# Patient Record
Sex: Female | Born: 1945 | Race: White | Hispanic: No
Health system: Southern US, Community
[De-identification: ages and names within clinical notes are randomized; demographics above are authoritative.]

## PROBLEM LIST (undated history)

## (undated) DIAGNOSIS — H269 Unspecified cataract: Secondary | ICD-10-CM

## (undated) DIAGNOSIS — M810 Age-related osteoporosis without current pathological fracture: Secondary | ICD-10-CM

## (undated) DIAGNOSIS — M1990 Unspecified osteoarthritis, unspecified site: Secondary | ICD-10-CM

## (undated) HISTORY — DX: Age-related osteoporosis without current pathological fracture: M81.0

## (undated) HISTORY — DX: Unspecified osteoarthritis, unspecified site: M19.90

## (undated) HISTORY — DX: Unspecified cataract: H26.9

---

## 2003-12-12 ENCOUNTER — Ambulatory Visit (HOSPITAL_COMMUNITY): Admission: RE | Admit: 2003-12-12 | Discharge: 2003-12-12 | Payer: Self-pay | Admitting: Neurosurgery

## 2004-01-07 ENCOUNTER — Encounter: Admission: RE | Admit: 2004-01-07 | Discharge: 2004-01-07 | Payer: Self-pay | Admitting: Neurosurgery

## 2004-01-28 ENCOUNTER — Encounter: Admission: RE | Admit: 2004-01-28 | Discharge: 2004-01-28 | Payer: Self-pay | Admitting: Neurosurgery

## 2004-02-25 ENCOUNTER — Ambulatory Visit (HOSPITAL_COMMUNITY): Admission: RE | Admit: 2004-02-25 | Discharge: 2004-02-26 | Payer: Self-pay | Admitting: Neurosurgery

## 2004-03-26 ENCOUNTER — Encounter: Admission: RE | Admit: 2004-03-26 | Discharge: 2004-03-26 | Payer: Self-pay | Admitting: Neurosurgery

## 2005-07-05 ENCOUNTER — Other Ambulatory Visit: Admission: RE | Admit: 2005-07-05 | Discharge: 2005-07-05 | Payer: Self-pay | Admitting: Dermatology

## 2006-09-07 ENCOUNTER — Ambulatory Visit (HOSPITAL_COMMUNITY): Admission: RE | Admit: 2006-09-07 | Discharge: 2006-09-07 | Payer: Self-pay | Admitting: Dermatology

## 2008-01-16 ENCOUNTER — Ambulatory Visit: Payer: Self-pay | Admitting: Orthopedic Surgery

## 2008-01-16 DIAGNOSIS — M7710 Lateral epicondylitis, unspecified elbow: Secondary | ICD-10-CM | POA: Insufficient documentation

## 2008-01-16 DIAGNOSIS — M19049 Primary osteoarthritis, unspecified hand: Secondary | ICD-10-CM | POA: Insufficient documentation

## 2008-03-03 ENCOUNTER — Ambulatory Visit: Payer: Self-pay | Admitting: Orthopedic Surgery

## 2008-03-03 DIAGNOSIS — M25549 Pain in joints of unspecified hand: Secondary | ICD-10-CM

## 2008-03-31 ENCOUNTER — Ambulatory Visit: Payer: Self-pay | Admitting: Orthopedic Surgery

## 2009-01-06 ENCOUNTER — Ambulatory Visit (HOSPITAL_COMMUNITY): Admission: RE | Admit: 2009-01-06 | Discharge: 2009-01-06 | Payer: Self-pay | Admitting: Pulmonary Disease

## 2009-01-14 ENCOUNTER — Ambulatory Visit (HOSPITAL_COMMUNITY): Admission: RE | Admit: 2009-01-14 | Discharge: 2009-01-14 | Payer: Self-pay | Admitting: Pulmonary Disease

## 2009-07-08 ENCOUNTER — Ambulatory Visit (HOSPITAL_COMMUNITY)
Admission: RE | Admit: 2009-07-08 | Discharge: 2009-07-08 | Payer: Self-pay | Admitting: Physical Medicine and Rehabilitation

## 2011-02-11 NOTE — Op Note (Signed)
NAME:  Holly [name], Holly [name]                         ACCOUNT NO.:  [id number]   MEDICAL RECORD NO.:  [id number]                   PATIENT TYPE:  OIB   LOCATION:  NA                                   FACILITY:  [facility]   PHYSICIAN:  Hilda Lias, M.D.                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   POSTOPERATIVE DIAGNOSIS:  Left L5 radiculopathy secondary to 4-5 stenosis.   PROCEDURE:  Left L4-5 laminotomy, foraminotomy with decompression L4-5 nerve  root.  Microscope used.   SURGEON:  Hilda Lias, M.D.   ANESTHESIA:  General.   INDICATIONS FOR PROCEDURE:  Ms. Wissink is a 65 year old female complaining  of back pain progressing down to her left leg.  The patient had two  injections.  One of them helped, and the other one did not.  Myelogram  showed she has degenerative disk disease at the L2-3, mostly going to the  right side and L4-5 she had lateral facet hypertrophy with bilateral  compromise.  In view of failed conservative treatment, surgery was advised.  The risks were explained at the history and physical.   DESCRIPTION OF PROCEDURE:  The patient was taken to the operating room and  after intubation, she was placed in a prone manner.  Back was prepped with  Betadine.  Midline incision from L4-L5 was made.  Muscles were retracted  laterally.  X-rays showed we were at L4-5.  We brought the microscope into  the area, went through the level of L4 and the upper L5.  A thick, yellow  ligament was also excised.   We found the thecal sac but it was difficult to visualize the l5 nerve root.  We went laterally, and we removed 1/3rd of the facet.  There were quite a  bit of adhesions and calcifications.  The area for the L5 nerve root was  quite narrow. Using the drill as well as the 2 and 3 mm __________  point,  foraminotomy to decompress the L5-S1 at the L4 was made.  In the end, we had  good decompression.  From then on, Valsalva maneuver was negative. Fentanyl  and Depo-Medrol were left in the epidural space. The wound was closed with  Vicryl and Steri-Strips.   Incidentally, the bones during the dissection showed that they were a little  soft with quite a bit of vascularity, probably secondary to osteopenia  and/or osteoporosis.                                               Hilda Lias, M.D.    EB/MEDQ  D:  02/25/2004  T:  02/25/2004  Job:  [id number]

## 2011-02-11 NOTE — H&P (Signed)
NAME:  Holly [name], Holly [name]                         ACCOUNT NO.:  [id number]   MEDICAL RECORD NO.:  [id number]                   PATIENT TYPE:  OIB   LOCATION:  NA                                   FACILITY:  [facility]   PHYSICIAN:  Hilda Lias, M.D.                DATE OF BIRTH:  [birth year]   DATE OF ADMISSION:  02/25/2004  DATE OF DISCHARGE:                                HISTORY & PHYSICAL   CLINICAL HISTORY:  Ms. Schupp is a lady who came to see me at the  beginning of February of 2005 with back pain and radiation down to the left  groin down to the knee and going to the left foot.  She also has complained  of numbness in that area.  In 1998, the patient was seen by me and an L4-5  diskectomy was done.  That was on the right side, not on the left side.  This time when she came, we decided to go ahead with conservative treatment.  Because of the findings, she wanted to proceed with injection.  Injections  were done twice, but she did not get any relief.  Because of the findings,  she wanted to proceed with surgery.  She denies any pain whatsoever in the  right leg.   PAST MEDICAL HISTORY:  Tubal ligation.   ALLERGIES:  She is not allergic to any medications.   SOCIAL HISTORY:  Negative.   FAMILY HISTORY:  Her father died at 60 with cancer of the lung.  Her mother  died at the age of 30 with heart failure.   REVIEW OF SYSTEMS:  Positive for back pain, left leg pain, and high  cholesterol.   PHYSICAL EXAMINATION:  HEENT:  Normal.  NECK:  Normal.  LUNGS:  Clear.  HEART:  Heart sounds normal.  ABDOMEN:  Normal.  EXTREMITIES:  Normal pulses.  NEUROLOGIC:  Mental status normal.  Cranial nerves normal.  Strength:  She  has weakening of dorsiflexion in the left foot.  She has some numbness which  involved the top of the left foot.  Straight leg raise is positive about 60  degrees.   LABORATORY DATA:  The lumbar x-ray shows degenerative disk disease at  multiple levels.  The  patient had a CT and post CT myelogram which showed  that she has degenerative disk disease with a bone spur at the level of L2-3  going to the right side.  At the level of L4-5, she has a severe case of  arthropathy bilaterally, left worse than right.  There is also a possibility  of herniated disk at the level of L4-5 on the left side.   CLINICAL IMPRESSION:  1. Left L4-5 herniated disk.  2. Facet arthropathy.  3. Incidental right L2-3 herniated disk.   RECOMMENDATIONS:  The patient is being admitted for a left L4-5 diskectomy.  She knows about the risks, such as  infection, CSF leak, worsening of the  pain, paralysis, and the possibility that she might require surgery in the  future, which may need fusion.  She knows that the surgery will not effect  whatsoever the findings on the right side.                                                Hilda Lias, M.D.    EB/MEDQ  D:  02/25/2004  T:  02/25/2004  Job:  [id number]

## 2011-02-28 ENCOUNTER — Inpatient Hospital Stay (HOSPITAL_COMMUNITY)
Admission: RE | Admit: 2011-02-28 | Discharge: 2011-03-06 | DRG: 419 | Disposition: A | Discharge status: Home / Self Care | Payer: Medicare Other | Source: Ambulatory Visit | Attending: General Surgery | Admitting: General Surgery

## 2011-02-28 ENCOUNTER — Inpatient Hospital Stay (HOSPITAL_COMMUNITY): Payer: Medicare Other

## 2011-02-28 DIAGNOSIS — Z23 Encounter for immunization: Secondary | ICD-10-CM

## 2011-02-28 DIAGNOSIS — R0989 Other specified symptoms and signs involving the circulatory and respiratory systems: Secondary | ICD-10-CM | POA: Diagnosis not present

## 2011-02-28 DIAGNOSIS — E860 Dehydration: Secondary | ICD-10-CM | POA: Diagnosis present

## 2011-02-28 DIAGNOSIS — K8010 Calculus of gallbladder with chronic cholecystitis without obstruction: Principal | ICD-10-CM | POA: Diagnosis present

## 2011-02-28 DIAGNOSIS — E7800 Pure hypercholesterolemia, unspecified: Secondary | ICD-10-CM | POA: Diagnosis present

## 2011-02-28 DIAGNOSIS — T41205A Adverse effect of unspecified general anesthetics, initial encounter: Secondary | ICD-10-CM | POA: Diagnosis not present

## 2011-02-28 DIAGNOSIS — F341 Dysthymic disorder: Secondary | ICD-10-CM | POA: Diagnosis present

## 2011-02-28 DIAGNOSIS — D696 Thrombocytopenia, unspecified: Secondary | ICD-10-CM | POA: Diagnosis present

## 2011-02-28 DIAGNOSIS — E876 Hypokalemia: Secondary | ICD-10-CM | POA: Diagnosis present

## 2011-02-28 DIAGNOSIS — R0609 Other forms of dyspnea: Secondary | ICD-10-CM | POA: Diagnosis not present

## 2011-02-28 LAB — DIFFERENTIAL
Basophils Relative: 0 % (ref 0–1)
Eosinophils Absolute: 0 10*3/uL (ref 0.0–0.7)
Eosinophils Relative: 0 % (ref 0–5)
Monocytes Absolute: 0.2 10*3/uL (ref 0.1–1.0)
Monocytes Relative: 5 % (ref 3–12)

## 2011-02-28 LAB — COMPREHENSIVE METABOLIC PANEL
AST: 98 U/L — ABNORMAL HIGH (ref 0–37)
Albumin: 3.7 g/dL (ref 3.5–5.2)
CO2: 27 mEq/L (ref 19–32)
Calcium: 8.8 mg/dL (ref 8.4–10.5)
Creatinine, Ser: 0.6 mg/dL (ref 0.4–1.2)
GFR calc Af Amer: >60 mL/min (ref >60)
GFR calc non Af Amer: >60 mL/min (ref >60)

## 2011-02-28 LAB — CBC
MCH: 28.8 pg (ref 26.0–34.0)
MCHC: 33.8 g/dL (ref 30.0–36.0)
Platelets: 53 10*3/uL — ABNORMAL LOW (ref 150–400)
RDW: 13.7 % (ref 11.5–15.5)

## 2011-02-28 LAB — URINE MICROSCOPIC-ADD ON

## 2011-02-28 LAB — URINALYSIS, ROUTINE W REFLEX MICROSCOPIC
Nitrite: NEGATIVE
Specific Gravity, Urine: >1.03 — ABNORMAL HIGH (ref 1.005–1.030)
pH: 6 (ref 5.0–8.0)

## 2011-03-01 ENCOUNTER — Inpatient Hospital Stay (HOSPITAL_COMMUNITY): Payer: Medicare Other

## 2011-03-01 LAB — ROCKY MTN SPOTTED FVR AB, IGG-BLOOD: RMSF IgG: 0.15 IV

## 2011-03-01 LAB — ROCKY MTN SPOTTED FVR AB, IGM-BLOOD: RMSF IgM: 0.56 IV (ref 0.00–0.89)

## 2011-03-02 ENCOUNTER — Inpatient Hospital Stay (HOSPITAL_COMMUNITY): Payer: Medicare Other

## 2011-03-02 ENCOUNTER — Encounter (HOSPITAL_COMMUNITY): Payer: Self-pay

## 2011-03-02 LAB — BASIC METABOLIC PANEL
CO2: 23 mEq/L (ref 19–32)
Calcium: 7.6 mg/dL — ABNORMAL LOW (ref 8.4–10.5)
Creatinine, Ser: <0.47 mg/dL (ref 0.4–1.2)

## 2011-03-02 LAB — URINE CULTURE

## 2011-03-02 LAB — HEPATITIS PANEL, ACUTE: Hepatitis B Surface Ag: NEGATIVE

## 2011-03-02 MED ORDER — TECHNETIUM TC 99M MEBROFENIN IV KIT
5.0000 | PACK | Freq: Once | INTRAVENOUS | Status: AC | PRN
  Administered 2011-03-02: 4.6 via INTRAVENOUS

## 2011-03-03 LAB — DIFFERENTIAL
Basophils Absolute: 0.2 10*3/uL — ABNORMAL HIGH (ref 0.0–0.1)
Basophils Absolute: 0.2 10*3/uL — ABNORMAL HIGH (ref 0.0–0.1)
Basophils Relative: 3 % — ABNORMAL HIGH (ref 0–1)
Eosinophils Absolute: 0 10*3/uL (ref 0.0–0.7)
Eosinophils Absolute: 0 10*3/uL (ref 0.0–0.7)
Eosinophils Relative: 0 % (ref 0–5)
Lymphocytes Relative: 25 % (ref 12–46)
Monocytes Absolute: 0.3 10*3/uL (ref 0.1–1.0)
Monocytes Absolute: 0.4 10*3/uL (ref 0.1–1.0)
Neutro Abs: 3.8 10*3/uL (ref 1.7–7.7)

## 2011-03-03 LAB — BASIC METABOLIC PANEL
CO2: 23 mEq/L (ref 19–32)
Calcium: 7.2 mg/dL — ABNORMAL LOW (ref 8.4–10.5)
Creatinine, Ser: <0.47 mg/dL (ref 0.4–1.2)
Glucose, Bld: 77 mg/dL (ref 70–99)

## 2011-03-03 LAB — CBC
MCH: 29.2 pg (ref 26.0–34.0)
MCHC: 35 g/dL (ref 30.0–36.0)
MCHC: 35.2 g/dL (ref 30.0–36.0)
MCV: 82.8 fL (ref 78.0–100.0)
Platelets: 76 10*3/uL — ABNORMAL LOW (ref 150–400)
RDW: 14.4 % (ref 11.5–15.5)
RDW: 14.6 % (ref 11.5–15.5)
WBC: 5.9 10*3/uL (ref 4.0–10.5)

## 2011-03-03 LAB — HEPATIC FUNCTION PANEL
AST: 121 U/L — ABNORMAL HIGH (ref 0–37)
Albumin: 2.1 g/dL — ABNORMAL LOW (ref 3.5–5.2)
Alkaline Phosphatase: 219 U/L — ABNORMAL HIGH (ref 39–117)
Total Bilirubin: 0.4 mg/dL (ref 0.3–1.2)

## 2011-03-04 ENCOUNTER — Inpatient Hospital Stay (HOSPITAL_COMMUNITY): Payer: Medicare Other

## 2011-03-04 ENCOUNTER — Other Ambulatory Visit: Payer: Self-pay | Admitting: General Surgery

## 2011-03-04 LAB — CBC
HCT: 30.3 % — ABNORMAL LOW (ref 36.0–46.0)
Hemoglobin: 10.6 g/dL — ABNORMAL LOW (ref 12.0–15.0)
MCH: 28.9 pg (ref 26.0–34.0)
MCV: 82.6 fL (ref 78.0–100.0)
RBC: 3.67 MIL/uL — ABNORMAL LOW (ref 3.87–5.11)
WBC: 7.9 10*3/uL (ref 4.0–10.5)

## 2011-03-04 LAB — PREPARE PLATELET PHERESIS

## 2011-03-04 LAB — DIFFERENTIAL
Lymphocytes Relative: 29 % (ref 12–46)
Lymphs Abs: 2.3 10*3/uL (ref 0.7–4.0)
Monocytes Relative: 7 % (ref 3–12)
Neutro Abs: 4.7 10*3/uL (ref 1.7–7.7)
Neutrophils Relative %: 60 % (ref 43–77)

## 2011-03-05 LAB — DIFFERENTIAL
Basophils Relative: 3 % — ABNORMAL HIGH (ref 0–1)
Eosinophils Relative: 1 % (ref 0–5)
Lymphs Abs: 3.3 10*3/uL (ref 0.7–4.0)
Monocytes Absolute: 0.8 10*3/uL (ref 0.1–1.0)
Monocytes Relative: 8 % (ref 3–12)

## 2011-03-05 LAB — CULTURE, BLOOD (ROUTINE X 2): Culture: NO GROWTH

## 2011-03-05 LAB — CBC
Hemoglobin: 10.3 g/dL — ABNORMAL LOW (ref 12.0–15.0)
MCH: 29.2 pg (ref 26.0–34.0)
MCV: 82.7 fL (ref 78.0–100.0)
Platelets: 212 10*3/uL (ref 150–400)
RBC: 3.53 MIL/uL — ABNORMAL LOW (ref 3.87–5.11)
WBC: 10.2 10*3/uL (ref 4.0–10.5)

## 2011-03-05 LAB — BASIC METABOLIC PANEL
CO2: 26 mEq/L (ref 19–32)
Calcium: 7.3 mg/dL — ABNORMAL LOW (ref 8.4–10.5)
Chloride: 103 mEq/L (ref 96–112)
Creatinine, Ser: <0.47 mg/dL (ref 0.4–1.2)
Glucose, Bld: 93 mg/dL (ref 70–99)
Sodium: 135 mEq/L (ref 135–145)

## 2011-03-05 LAB — PREPARE PLATELET PHERESIS

## 2011-03-06 LAB — CBC
HCT: 32.4 % — ABNORMAL LOW (ref 36.0–46.0)
Hemoglobin: 11.1 g/dL — ABNORMAL LOW (ref 12.0–15.0)
MCV: 83.1 fL (ref 78.0–100.0)
RDW: 15.4 % (ref 11.5–15.5)
WBC: 12.4 10*3/uL — ABNORMAL HIGH (ref 4.0–10.5)

## 2011-03-06 LAB — CROSSMATCH
ABO/RH(D): A NEG
Antibody Screen: NEGATIVE
Unit division: 0

## 2011-03-06 LAB — CULTURE, BLOOD (ROUTINE X 2): Culture: NO GROWTH

## 2011-03-06 LAB — DIFFERENTIAL
Basophils Relative: 2 % — ABNORMAL HIGH (ref 0–1)
Eosinophils Relative: 1 % (ref 0–5)
Lymphocytes Relative: 34 % (ref 12–46)
Lymphs Abs: 4.2 10*3/uL — ABNORMAL HIGH (ref 0.7–4.0)
Monocytes Relative: 10 % (ref 3–12)
Neutro Abs: 6.7 10*3/uL (ref 1.7–7.7)

## 2011-03-06 LAB — BASIC METABOLIC PANEL
BUN: 2 mg/dL — ABNORMAL LOW (ref 6–23)
CO2: 23 mEq/L (ref 19–32)
Glucose, Bld: 89 mg/dL (ref 70–99)
Potassium: 3.3 mEq/L — ABNORMAL LOW (ref 3.5–5.1)
Sodium: 138 mEq/L (ref 135–145)

## 2011-03-06 LAB — HEPARIN INDUCED THROMBOCYTOPENIA PNL
UFH High Dose UFH H: 0 % Release
UFH Low Dose 0.5 IU/mL: 0 % Release
UFH SRA Result: NEGATIVE

## 2011-03-08 NOTE — Op Note (Signed)
NAMEAFNAN, CADIENTE NO.:  [id number]  MEDICAL RECORD NO.:  [id number]  LOCATION:                                 FACILITY:  PHYSICIAN:  Dalia Heading, M.D.  DATE OF BIRTH:  1946-07-20  DATE OF PROCEDURE:  03/04/2011 DATE OF DISCHARGE:                              OPERATIVE REPORT   PREOPERATIVE DIAGNOSES:  Cholecystitis, cholelithiasis.  POSTOPERATIVE DIAGNOSES:  Cholecystitis, cholelithiasis.  PROCEDURE:  Laparoscopic cholecystectomy.  SURGEON:  Dalia Heading, MD  ANESTHESIA:  General endotracheal.  INDICATIONS:  The patient is a 65 year old white female who presents with right upper quadrant abdominal pain secondary to a biliary sludge. She was also noted to have thrombocytopenia.  She did receive two platelet transfusions.  The patient now comes to the operating room for laparoscopic cholecystectomy.  The risks and benefits of the procedure including bleeding, infection, hepatobiliary injury, and the possibility of a blood transfusion were fully explained to the patient, gave informed consent.  PROCEDURE NOTE:  The patient was placed in supine position.  After induction of general endotracheal anesthesia, the abdomen was prepped and draped using the usual sterile technique with DuraPrep.  Surgical site confirmation was performed.  A supraumbilical incision was made down to the fascia.  A Veress needle was introduced into the abdominal cavity and confirmation of placement was done using the saline drop test.  The abdomen was then insufflated to 16 mmHg pressure.  An 11-mm trocar was introduced into the abdominal cavity under direct visualization without difficulty.  The patient was placed in reversed Trendelenburg position.  Additional 11-mm trocar was placed in the epigastric region and 5-mm trocars were placed in the right upper quadrant and right flank regions.  Liver was inspected and noted to be within normal limits.  The gallbladder was  retracted superior and laterally.  The dissection was begun around the infundibulum of the gallbladder.  The cystic duct was first identified. Its juncture to the infundibulum was fully identified.  EndoClip were placed proximally and distally on the cystic duct, and the cystic duct was divided.  This was likewise done of the cystic artery.  The gallbladder was then freed away from the gallbladder fossa using Bovie electrocautery.  The gallbladder was then delivered through the epigastric trocar site using Endocatch bag.  The gallbladder fossa was inspected and no abnormal bleeding or bile leakage was noted.  Surgicel was placed in the gallbladder fossa.  All fluid and air were then evacuated from the abdominal cavity prior to removal of the trocars.  All wounds were irrigated with normal saline.  All wounds were checked with 0.5% to Sensorcaine.  The supraumbilical fascia was reapproximated using an 0 Vicryl interrupted suture.  All skin incisions were closed using staples.  Betadine ointment and dry sterile dressings were applied.All tape and needle counts were correct at the end of the procedure. The patient was extubated in the operating room and went back to recovery room, awake in stable condition.  COMPLICATIONS:  None.  SPECIMEN:  Gallbladder.  BLOOD LOSS:  Minimal.     Dalia Heading, M.D.     MAJ/MEDQ  D:  03/04/2011  T:  03/05/2011  Job:  [id number]  cc:   Ramon Dredge L. Juanetta Gosling, M.D. Fax: [phone]  Electronically Signed by Franky Macho M.D. on 03/08/2011 12:27:19 PM

## 2011-03-08 NOTE — Discharge Summary (Signed)
  NAME:  Holly [name], BUSSER NO.:  [id number]  MEDICAL RECORD NO.:  [id number]  LOCATION:  IC04                          FACILITY:  [facility]  PHYSICIAN:  Dalia Heading, M.D.  DATE OF BIRTH:  [birth year]  DATE OF ADMISSION:  02/28/2011 DATE OF DISCHARGE:  06/10/2012LH                              DISCHARGE SUMMARY   HOSPITAL COURSE SUMMARY:  The patient is a 65 year old white female who was admitted by Dr. Kari Baars for fever of unknown etiology. Workup revealed cholecystitis secondary to cholelithiasis.  Surgery consultation was obtained.  Preoperatively, she had thrombocytopenia of unknown etiology.  She presented with this at the time of admission, though it was elected to stop her Lovenox.  She was given platelet transfusions prior to surgical intervention.  She subsequently went to the operating room on March 04, 2011, underwent laparoscopic cholecystectomy.  Her immediate postoperative course was remarkable for a prolonged neuromuscular blockade secondary to anesthesia.  She had to be reintubated in the operating room, but then subsequently was extubated again in recovery.  She did go to the Intensive Care Unit for closer monitoring.  Her subsequent course was for the most part unremarkable.  Her platelet count rebounded to normal the following day. Chest x-ray did reveal a question of perihilar infiltrates, thus she was continued on Unasyn.  Her diet was advanced without difficulty.  She is being discharged home on postoperative day #2 in good improving condition.  DISCHARGE INSTRUCTIONS:  The patient is to follow up Dr. Franky Macho on March 10, 2011.  DISCHARGE MEDICATIONS: 1. Vicodin 1-2 tablets p.o. q.4 h. p.r.n. pain. 2. Augmentin 1 tablet p.o. b.i.d. x1 week. 3. Calcitonin nasal spray. 4. Effexor ER 75 mg p.o. daily. 5. Simvastatin 40 mg p.o. daily.  PRINCIPAL DIAGNOSES: 1. Cholecystitis, cholelithiasis. 2. Thrombocytopenia, resolved. 3. High  cholesterol levels. 4. Anxiety/depression.  PRINCIPAL PROCEDURE:  Laparoscopic cholecystectomy on March 04, 2011.     Dalia Heading, M.D.     MAJ/MEDQ  D:  03/06/2011  T:  03/06/2011  Job:  [id number]  cc:   Ramon Dredge L. Juanetta Gosling, M.D. Fax: [phone]  Electronically Signed by Franky Macho M.D. on 03/08/2011 12:27:17 PM

## 2011-03-09 NOTE — Group Therapy Note (Signed)
  NAMEJILLYAN, [name]               ACCOUNT NO.:  [id number]  MEDICAL RECORD NO.:  [id number]  LOCATION:  A302                          FACILITY:  [facility]  PHYSICIAN:  Laural Eiland L. Juanetta Gosling, M.D.DATE OF BIRTH:  [birth year]  DATE OF PROCEDURE: DATE OF DISCHARGE:                                PROGRESS NOTE   [name] says she feels better and indeed she does look better today.  Her temperature has resolved with a change in antibiotics.  She has been seen by Dr. Lovell Sheehan and my thanks to him.  PHYSICAL EXAMINATION:  Her exam this morning shows temperature is 98.2, pulse 104, respirations 18, blood pressure 123/72, O2 sats 87% on room air.  Her chest is clear, however, she still has some tenderness in the right upper quadrant, says she has had some nausea.  Her hepatobiliary scan yesterday showed possible filling defect in the gallbladder, gallbladder ejection fraction of 8%.  Of note, is the fact that her CBC shows white count is 5800, hemoglobin is 10.7, platelets only 31,000, though.  On admission, her platelet count was 53,000.  Assessment then she has cholecystitis.  She is going to have surgery tomorrow.  She does have some thrombocytopenia, I have discussed that with Dr. Lovell Sheehan.  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  She is going to be on some oxygen as needed.  I will plan to transfer her to Dr. York [facility].  I am going to investigate whether she has had previous low platelet count.  She has been followed by Dr. Margo Common.     Tamee Battin L. Juanetta Gosling, M.D.     ELH/MEDQ  D:  03/03/2011  T:  03/03/2011  Job:  [id number]  Electronically Signed by Kari Baars M.D. on 03/09/2011 01:38:07 PM

## 2011-03-09 NOTE — H&P (Signed)
  NAMEWILMER, BERRYHILL NO.:  [id number]  MEDICAL RECORD NO.:  [id number]  LOCATION:                                 FACILITY:  PHYSICIAN:  Williemae Muriel L. Juanetta Gosling, M.D.DATE OF BIRTH:  27-Feb-1946  DATE OF ADMISSION:  02/28/2011 DATE OF DISCHARGE:  LH                             HISTORY & PHYSICAL   REASON FOR ADMISSION:  Febrile illness.  HISTORY:  [name] is a 65 year old Caucasian female who came to my office because of a 3-4 day history of fever, chills and weakness.  She says she has been drinking a lot of fluids but seems to still be having problems.  When I saw her in my office she was clearly very weak.  She was dehydrated and had a temperature of 101.8, so I felt she needed to be admitted to the hospital and admitted her.  She has had some cough. She has chronic back pain.  She has not had any urinary symptoms.  She has had a tick bite and it is not totally clear where this is from.  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  MEDICATIONS:  She has been taking simvastatin 40 mg daily, Effexor XR 75 mg daily, and alendronate 70 mg weekly.  SOCIAL HISTORY:  She does not use tobacco or alcohol.  FAMILY HISTORY:  Her father died in his 59s of lung cancer.  Mother died in her 21s of heart failure.  REVIEW OF SYSTEMS:  Except as mentioned is negative.  PHYSICAL EXAMINATION:  GENERAL:  Her temperature is as mentioned.  She looks very dry. HEENT:  Her pupils are reactive.  Her nose and throat are clear. NECK:  Supple without masses. CHEST:  No wheezes, rales, or rhonchi. HEART:  Regular without murmur, gallop, or rub. ABDOMEN:  Soft.  No masses felt.  Bowel sounds present and active. EXTREMITIES:  No clubbing, cyanosis, or edema.  She does not have her rash. CENTRAL NERVOUS SYSTEM:  Grossly intact.  Her tympanic membranes are intact.  Dentition is  good.  ASSESSMENT:  She is acutely ill with febrile illness and it is not clear what this is, she is dehydrated and I am going to admit her to the hospital, start on antibiotics, get blood cultures.  Continue with everything else and follow.     Holly [name] L. Juanetta Gosling, M.D.     ELH/MEDQ  D:  02/28/2011  T:  03/01/2011  Job:  [id number]  Electronically Signed by Kari Baars M.D. on 03/09/2011 01:37:50 PM

## 2011-03-09 NOTE — Group Therapy Note (Signed)
  [name], [name]               ACCOUNT NO.:  [id number]  MEDICAL RECORD NO.:  [id number]  LOCATION:  A302                          FACILITY:  [facility]  PHYSICIAN:  Holly [name], M.D.DATE OF BIRTH:  1946-02-04  DATE OF PROCEDURE: DATE OF DISCHARGE:                                PROGRESS NOTE   Holly [name] was admitted with febrile illness.  She was hypokalemic and dehydrated.  She says she feels some better, but she had temperature as high as 102.5 earlier and then 103 this morning.  She is on antibiotics. It appears that she has a urinary tract infection, but urine culture is still pending.  Her liver enzymes were also elevated, so I am going to have her get all that rechecked as well.  She is going to have an ultrasound of the abdomen.  At this point then, she has fever.  She is better I think with her hydration and my plan is to continue with current treatments and medications.  No changes.  Get the ultrasound.  I will check hepatitis profile and await cultures.  I will get more blood cultures if she spikes again.     Asmara Backs L. [name], M.D.     ELH/MEDQ  D:  03/01/2011  T:  03/01/2011  Job:  [id number]  Electronically Signed by Kari Baars M.D. on 03/09/2011 01:37:58 PM

## 2011-03-09 NOTE — Group Therapy Note (Signed)
  Holly [name], Holly [name]               ACCOUNT NO.:  [id number]  MEDICAL RECORD NO.:  [id number]  LOCATION:  A302                          FACILITY:  [facility]  PHYSICIAN:  Barrie Sigmund L. [name], M.D.DATE OF BIRTH:  06/15/1946  DATE OF PROCEDURE: DATE OF DISCHARGE:                                PROGRESS NOTE   Holly [name] continues to have fever.  Her ultrasound yesterday showed some thickening of the gallbladder wall, some fluid around the gallbladder sludge in the gallbladder and I think she probably does have cholecystitis.  I have asked for surgical consultation and asked for a HIDA scan and it is our recommendation from Radiology.  This morning, she is awake and alert, says she has more nausea and more pain.  Her temperature is 102.9, pulse 112, respirations 16, blood pressure 118/67, O2 sats 90% on room air.  Her abdomen is mildly tender diffusely.  ASSESSMENT:  She is having more trouble.  PLAN:  I have discussed her situation with Dr. Lovell Sheehan.  He will see her after the scan is done.  We will probably change her antibiotics now that we have a better source of infection and the Rocephin, Zithromax combination does not seem to be helping, and I planned for her to probably have surgery later this week.     Holly [name], M.D.     ELH/MEDQ  D:  03/02/2011  T:  03/02/2011  Job:  [id number]  Electronically Signed by Kari Baars M.D. on 03/09/2011 01:38:01 PM

## 2012-06-15 IMAGING — CR DG CHEST 2V
2 series · 2 of 2 positions shown · non-contrast
Comparison: None

CLINICAL DATA: Febrile illness

CHEST - 2 VIEW

[view not recorded (1 of 2)]
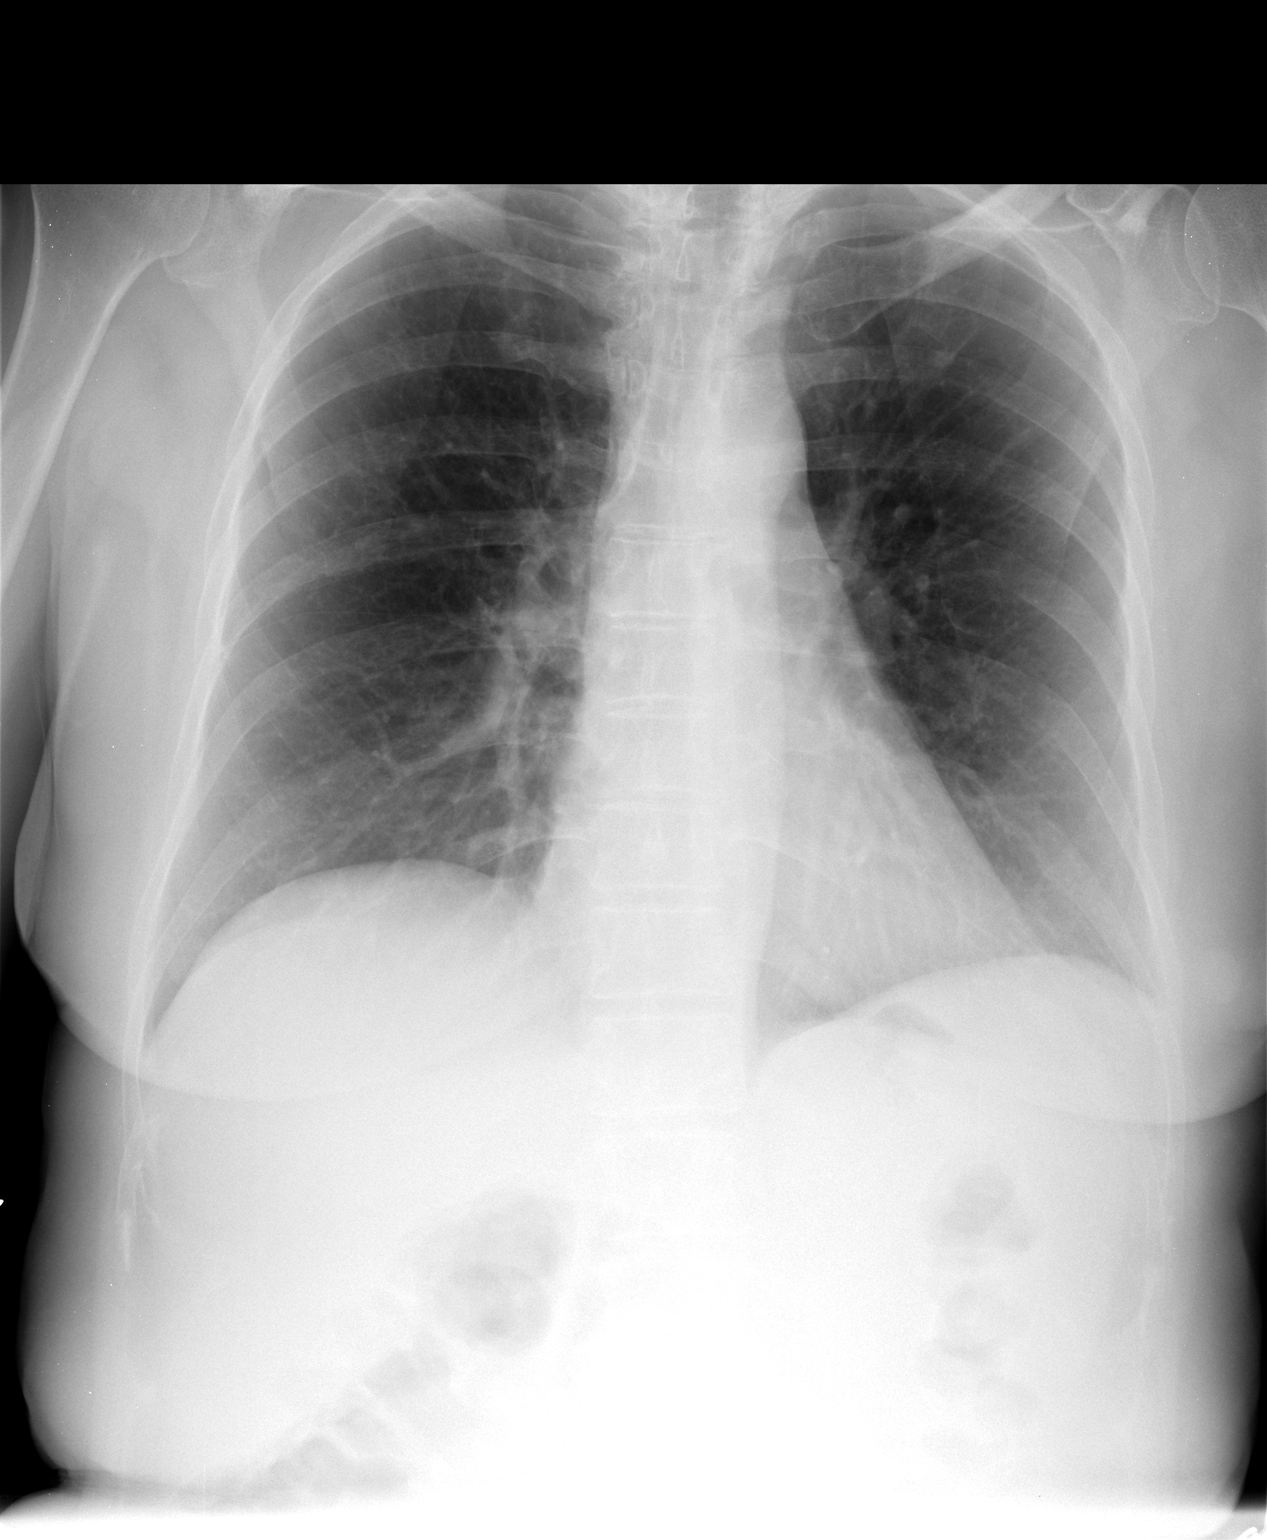

[view not recorded (2 of 2)]
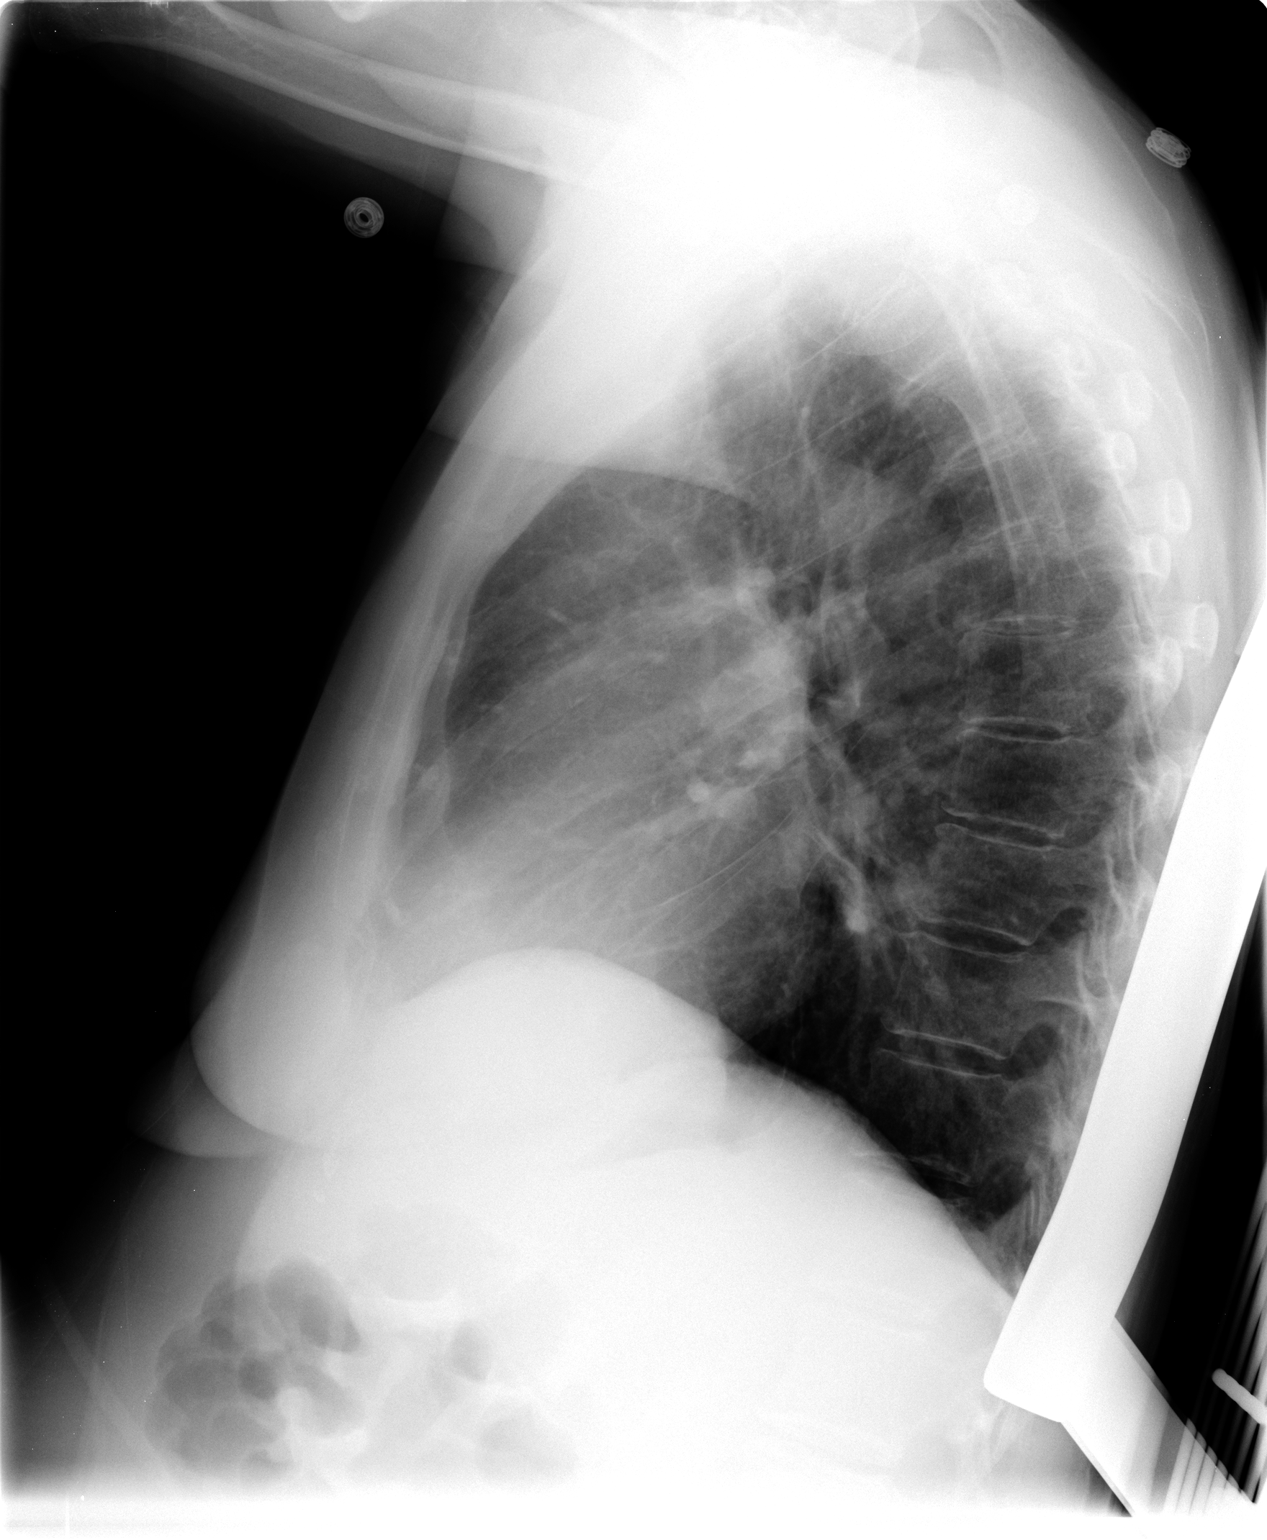

[2 of 2 positions shown; findings below may reference images not displayed]

FINDINGS: Normal heart size, mediastinal contours, and pulmonary vascularity.
Lungs clear.
No pleural effusion or pneumothorax.
Bones unremarkable.
IMPRESSION: No acute abnormalities.

## 2012-06-16 IMAGING — US US ABDOMEN COMPLETE
1 series · 13 of 25 positions shown · non-contrast
Comparison: 01/06/2009

CLINICAL DATA: Abnormal/elevated liver function tests

ULTRASOUND ABDOMEN:
TECHNIQUE: Sonography of upper abdominal structures was performed.

[Series 1: us abdomen complete · 0.28mm/px · 13 of 63 slices shown]
[im 1/63]
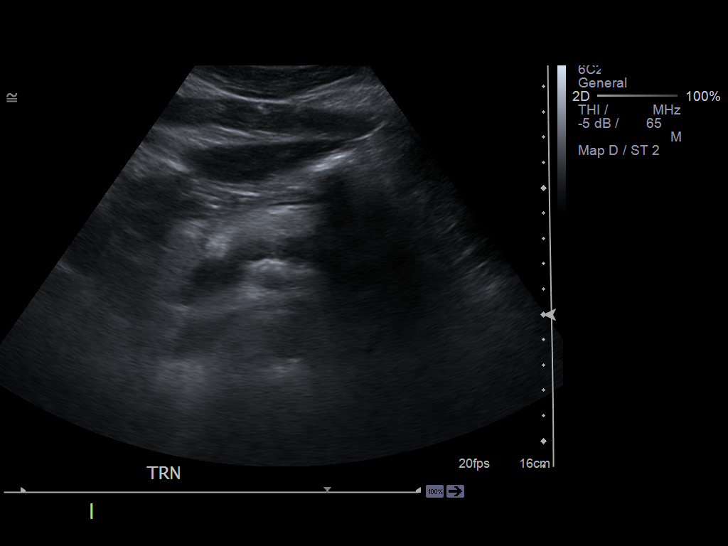
[im 6/63]
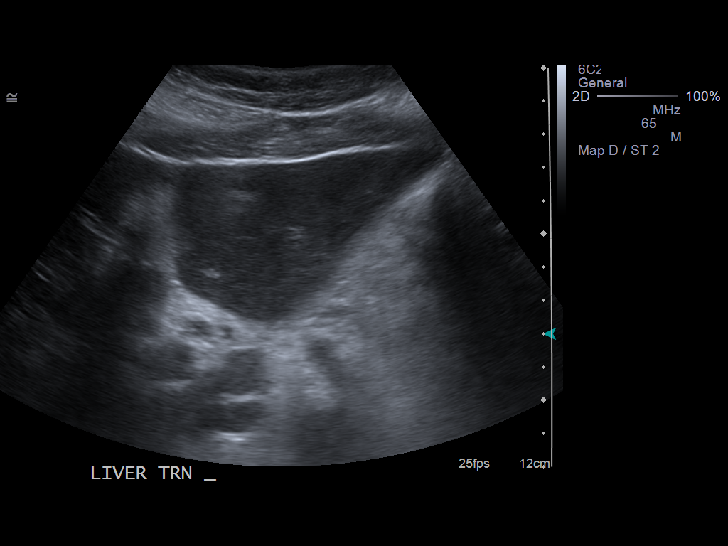
[im 11/63]
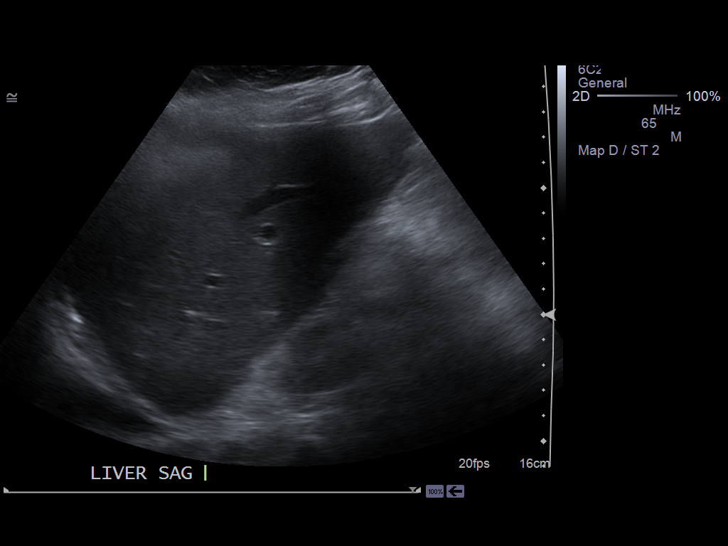
[im 16/63]
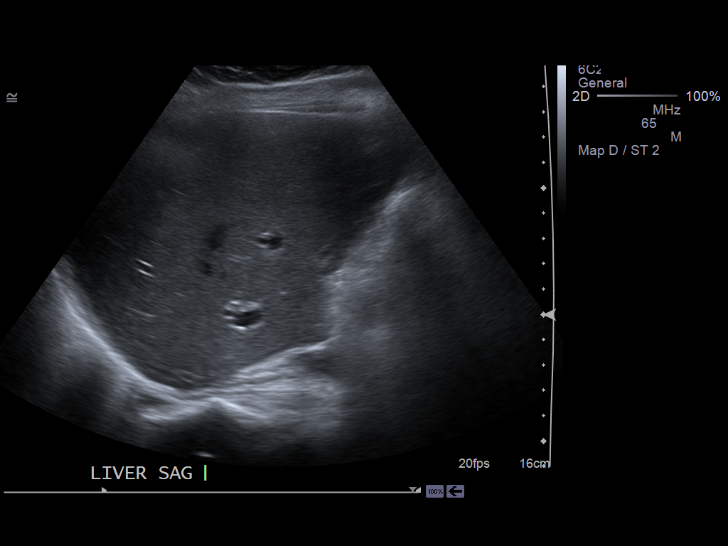
[im 21/63]
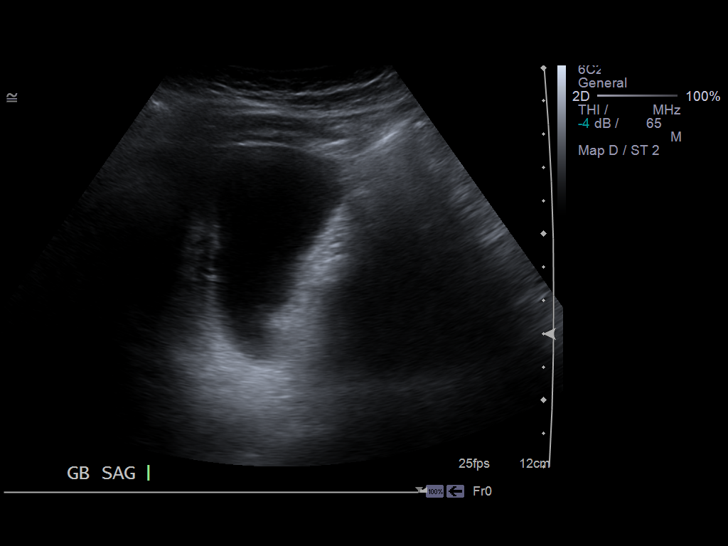
[im 26/63]
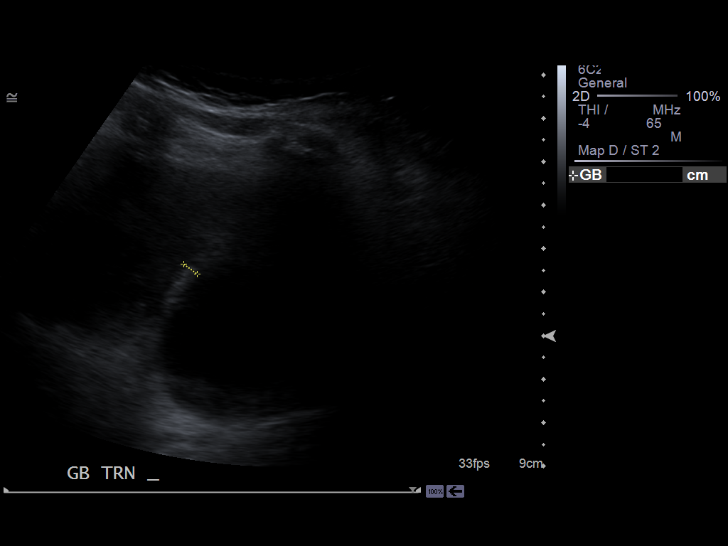
[im 32/63]
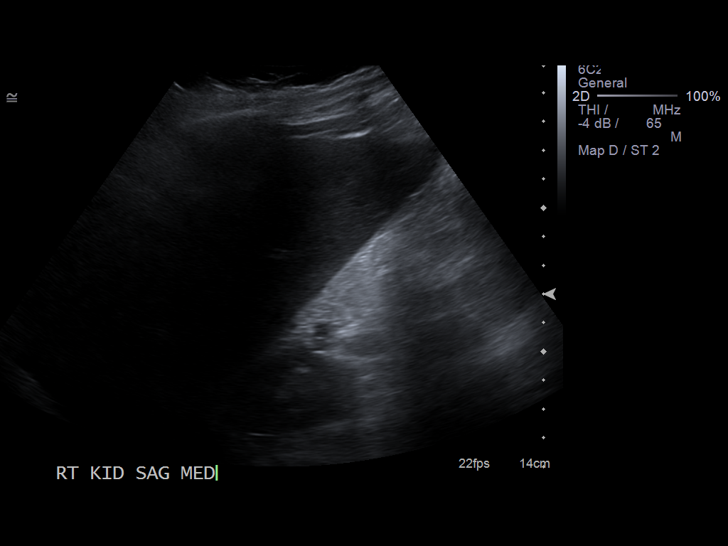
[im 37/63]
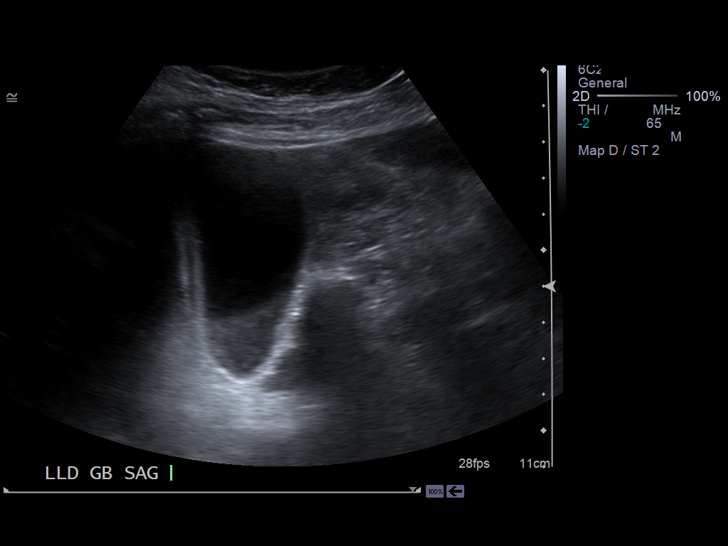
[im 42/63]
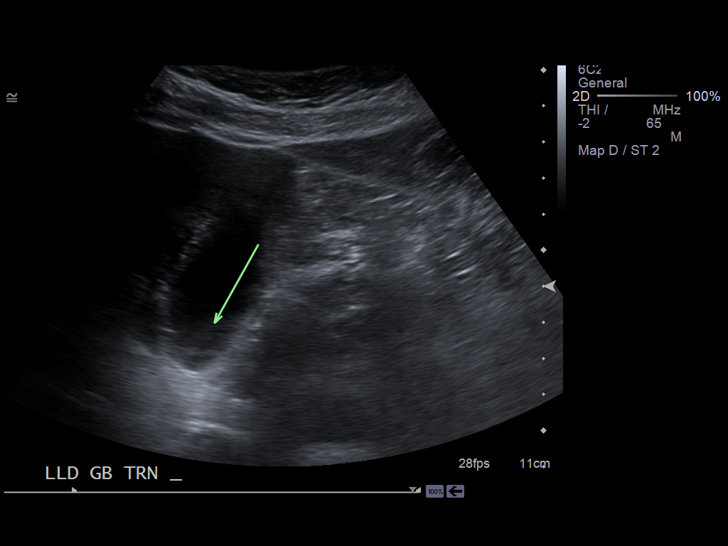
[im 47/63]
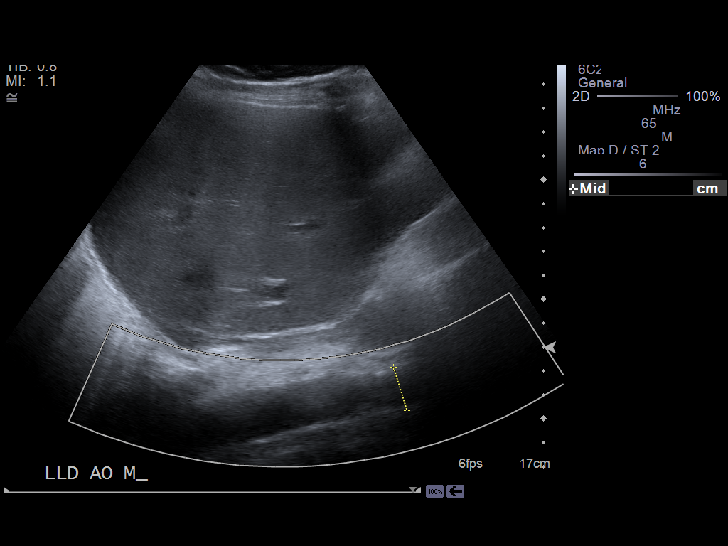
[im 52/63]
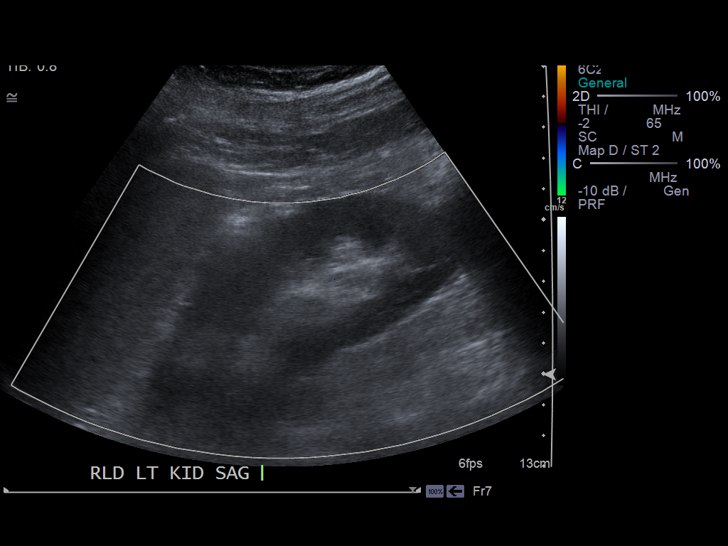
[im 57/63]
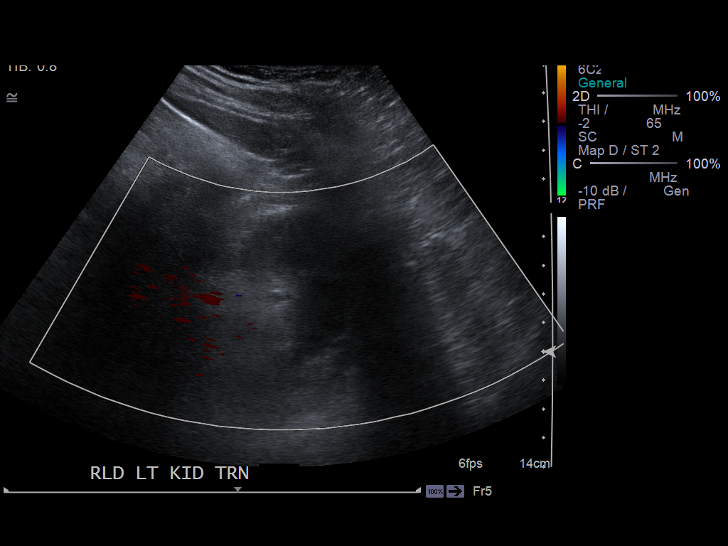
[im 63/63]
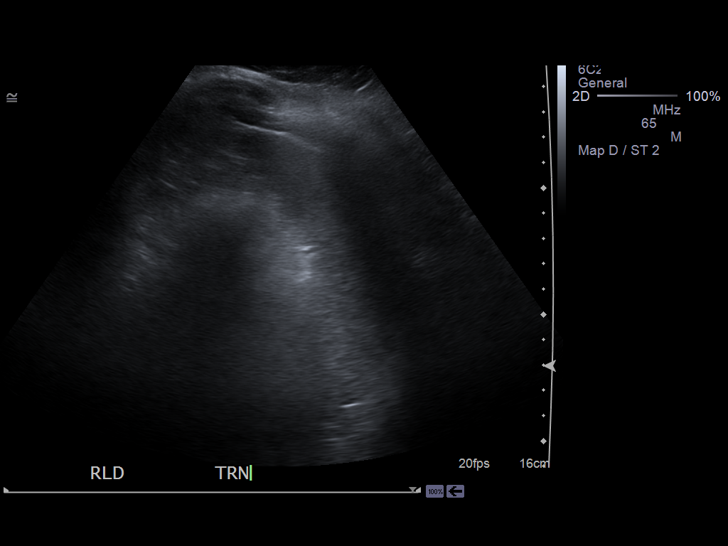

[13 of 25 positions shown; findings below may reference images not displayed]

Gallbladder:  Sludge within gallbladder.  No discrete shadowing
gallstones.  Gallbladder wall is thickened with edema in
gallbladder wall as well as a small amount of question
pericholecystic fluid.  No sonographic Murphy's sign.

Common bile duct:  7 mm diameter, minimally prominent.

Liver:  Mild dilatation of central intrahepatic biliary ducts.  No
definite focal hepatic mass.  Hepatopetal portal venous flow.

IVC:  Normal appearance

Pancreas:  Normal appearance

Spleen:  Normal appearance, 8.4 cm length.

Right kidney:  9.7 cm length. Normal morphology without mass or
hydronephrosis.

Left kidney:  12.5 cm length. Normal morphology without mass or
hydronephrosis.

Aorta:  Normal appearance

Other:  No free fluid.  Small right pleural effusion incidentally
noted.
IMPRESSION: Sludge within gallbladder, with thickening of gallbladder wall
question minimal pericholecystic fluid.
While no definite gallstones or sonographic Murphy's sign are
identified, early acute cholecystitis is not completely excluded;
consider radionuclide hepatobiliary scan to assess CBD patency.
Minimally prominent CBD and central intrahepatic radicles, though
potentially this could be physiologic related to age; CBD diameter
of 7 mm of unchanged since the prior study of 01/06/2009.
Remainder of exam unremarkable.

## 2014-12-23 ENCOUNTER — Other Ambulatory Visit (HOSPITAL_COMMUNITY): Payer: Self-pay | Admitting: Pulmonary Disease

## 2014-12-23 DIAGNOSIS — R7989 Other specified abnormal findings of blood chemistry: Secondary | ICD-10-CM

## 2014-12-23 DIAGNOSIS — R945 Abnormal results of liver function studies: Principal | ICD-10-CM

## 2014-12-26 ENCOUNTER — Other Ambulatory Visit (HOSPITAL_COMMUNITY): Payer: Self-pay | Admitting: Pulmonary Disease

## 2014-12-26 ENCOUNTER — Ambulatory Visit (HOSPITAL_COMMUNITY)
Admission: RE | Admit: 2014-12-26 | Discharge: 2014-12-26 | Disposition: A | Discharge status: Home / Self Care | Payer: Medicare Other | Source: Ambulatory Visit | Attending: Pulmonary Disease | Admitting: Pulmonary Disease

## 2014-12-26 DIAGNOSIS — R7989 Other specified abnormal findings of blood chemistry: Secondary | ICD-10-CM | POA: Diagnosis not present

## 2014-12-26 DIAGNOSIS — Z9049 Acquired absence of other specified parts of digestive tract: Secondary | ICD-10-CM | POA: Insufficient documentation

## 2014-12-26 DIAGNOSIS — K838 Other specified diseases of biliary tract: Secondary | ICD-10-CM | POA: Insufficient documentation

## 2014-12-26 DIAGNOSIS — R945 Abnormal results of liver function studies: Secondary | ICD-10-CM

## 2015-01-02 ENCOUNTER — Other Ambulatory Visit (HOSPITAL_COMMUNITY): Payer: Self-pay | Admitting: Pulmonary Disease

## 2015-01-02 DIAGNOSIS — K838 Other specified diseases of biliary tract: Secondary | ICD-10-CM

## 2015-01-08 ENCOUNTER — Ambulatory Visit (HOSPITAL_COMMUNITY)
Admission: RE | Admit: 2015-01-08 | Discharge: 2015-01-08 | Disposition: A | Discharge status: Home / Self Care | Payer: Medicare Other | Source: Ambulatory Visit | Attending: Pulmonary Disease | Admitting: Pulmonary Disease

## 2015-01-08 ENCOUNTER — Other Ambulatory Visit (HOSPITAL_COMMUNITY): Payer: Self-pay | Admitting: Pulmonary Disease

## 2015-01-08 DIAGNOSIS — K838 Other specified diseases of biliary tract: Secondary | ICD-10-CM | POA: Diagnosis present

## 2015-01-08 LAB — POCT I-STAT CREATININE: CREATININE: 0.6 mg/dL (ref 0.50–1.10)

## 2015-01-08 MED ORDER — GADOBENATE DIMEGLUMINE 529 MG/ML IV SOLN
12.0000 mL | Freq: Once | INTRAVENOUS | Status: AC | PRN
  Administered 2015-01-08: 12 mL via INTRAVENOUS

## 2015-01-08 MED ORDER — SODIUM CHLORIDE 0.9 % IV SOLN
INTRAVENOUS | Status: DC
Start: 2015-01-08 — End: 2015-01-09
  Filled 2015-01-08: qty 50

## 2015-02-24 ENCOUNTER — Other Ambulatory Visit (HOSPITAL_COMMUNITY): Payer: Self-pay | Admitting: Pulmonary Disease

## 2015-02-24 DIAGNOSIS — M8580 Other specified disorders of bone density and structure, unspecified site: Secondary | ICD-10-CM

## 2015-02-27 ENCOUNTER — Ambulatory Visit (HOSPITAL_COMMUNITY)
Admission: RE | Admit: 2015-02-27 | Discharge: 2015-02-27 | Disposition: A | Discharge status: Home / Self Care | Payer: Medicare Other | Source: Ambulatory Visit | Attending: Pulmonary Disease | Admitting: Pulmonary Disease

## 2015-02-27 DIAGNOSIS — Z780 Asymptomatic menopausal state: Secondary | ICD-10-CM | POA: Diagnosis not present

## 2015-02-27 DIAGNOSIS — M810 Age-related osteoporosis without current pathological fracture: Secondary | ICD-10-CM | POA: Insufficient documentation

## 2015-02-27 DIAGNOSIS — M8580 Other specified disorders of bone density and structure, unspecified site: Secondary | ICD-10-CM

## 2015-12-24 ENCOUNTER — Other Ambulatory Visit (HOSPITAL_COMMUNITY): Payer: Self-pay | Admitting: Pulmonary Disease

## 2015-12-24 ENCOUNTER — Ambulatory Visit (HOSPITAL_COMMUNITY)
Admission: RE | Admit: 2015-12-24 | Discharge: 2015-12-24 | Disposition: A | Discharge status: Home / Self Care | Payer: Medicare Other | Source: Ambulatory Visit | Attending: Pulmonary Disease | Admitting: Pulmonary Disease

## 2015-12-24 DIAGNOSIS — M79601 Pain in right arm: Secondary | ICD-10-CM | POA: Diagnosis not present

## 2015-12-24 DIAGNOSIS — M2578 Osteophyte, vertebrae: Secondary | ICD-10-CM | POA: Diagnosis not present

## 2015-12-24 DIAGNOSIS — M4802 Spinal stenosis, cervical region: Secondary | ICD-10-CM | POA: Diagnosis not present

## 2015-12-24 DIAGNOSIS — M50321 Other cervical disc degeneration at C4-C5 level: Secondary | ICD-10-CM | POA: Diagnosis not present

## 2015-12-24 DIAGNOSIS — M50322 Other cervical disc degeneration at C5-C6 level: Secondary | ICD-10-CM | POA: Insufficient documentation

## 2015-12-24 DIAGNOSIS — R52 Pain, unspecified: Secondary | ICD-10-CM

## 2015-12-24 DIAGNOSIS — M47892 Other spondylosis, cervical region: Secondary | ICD-10-CM | POA: Diagnosis not present

## 2017-06-07 ENCOUNTER — Other Ambulatory Visit (HOSPITAL_COMMUNITY): Payer: Self-pay | Admitting: Pulmonary Disease

## 2017-06-07 ENCOUNTER — Ambulatory Visit (HOSPITAL_COMMUNITY)
Admission: RE | Admit: 2017-06-07 | Discharge: 2017-06-07 | Disposition: A | Discharge status: Home / Self Care | Payer: Medicare Other | Source: Ambulatory Visit | Attending: Pulmonary Disease | Admitting: Pulmonary Disease

## 2017-06-07 DIAGNOSIS — Z801 Family history of malignant neoplasm of trachea, bronchus and lung: Secondary | ICD-10-CM | POA: Diagnosis present

## 2017-09-11 DIAGNOSIS — M5431 Sciatica, right side: Secondary | ICD-10-CM | POA: Insufficient documentation

## 2017-10-25 DIAGNOSIS — M25561 Pain in right knee: Secondary | ICD-10-CM

## 2017-10-25 DIAGNOSIS — G8929 Other chronic pain: Secondary | ICD-10-CM | POA: Insufficient documentation

## 2018-02-20 ENCOUNTER — Other Ambulatory Visit: Payer: Self-pay

## 2018-02-20 ENCOUNTER — Encounter: Payer: Self-pay | Admitting: Podiatry

## 2018-02-20 ENCOUNTER — Ambulatory Visit: Payer: Medicare Other | Admitting: Podiatry

## 2018-02-20 ENCOUNTER — Ambulatory Visit (INDEPENDENT_AMBULATORY_CARE_PROVIDER_SITE_OTHER): Payer: Medicare Other

## 2018-02-20 VITALS — BP 148/89 | HR 78 | Resp 16

## 2018-02-20 DIAGNOSIS — M2041 Other hammer toe(s) (acquired), right foot: Secondary | ICD-10-CM | POA: Diagnosis not present

## 2018-02-20 DIAGNOSIS — M2042 Other hammer toe(s) (acquired), left foot: Secondary | ICD-10-CM | POA: Diagnosis not present

## 2018-02-20 DIAGNOSIS — M898X9 Other specified disorders of bone, unspecified site: Secondary | ICD-10-CM

## 2018-02-20 NOTE — Patient Instructions (Signed)
Pre-Operative Instructions  Congratulations, you have decided to take an important step towards improving your quality of life.  You can be assured that the doctors and staff at [facility] will be with you every step of the way.  Here are some important things you should know:  1. Plan to be at the surgery center/hospital at least 1 (one) hour prior to your scheduled time, unless otherwise directed by the surgical center/hospital staff.  You must have a responsible adult accompany you, remain during the surgery and drive you home.  Make sure you have directions to the surgical center/hospital to ensure you arrive on time. 2. If you are having surgery at [facility] or [facility], you will need a copy of your medical history and physical form from your family physician within one month prior to the date of surgery. We will give you a form for your primary physician to complete.  3. We make every effort to accommodate the date you request for surgery.  However, there are times where surgery dates or times have to be moved.  We will contact you as soon as possible if a change in schedule is required.   4. No aspirin/ibuprofen for one week before surgery.  If you are on aspirin, any non-steroidal anti-inflammatory medications (Mobic, Aleve, Ibuprofen) should not be taken seven (7) days prior to your surgery.  You make take Tylenol for pain prior to surgery.  5. Medications - If you are taking daily heart and blood pressure medications, seizure, reflux, allergy, asthma, anxiety, pain or diabetes medications, make sure you notify the surgery center/hospital before the day of surgery so they can tell you which medications you should take or avoid the day of surgery. 6. No food or drink after midnight the night before surgery unless directed otherwise by surgical center/hospital staff. 7. No alcoholic beverages 24-hours prior to surgery.  No smoking 24-hours prior or 24-hours after  surgery. 8. Wear loose pants or shorts. They should be loose enough to fit over bandages, boots, and casts. 9. Don't wear slip-on shoes. Sneakers are preferred. 10. Bring your boot with you to the surgery center/hospital.  Also bring crutches or a walker if your physician has prescribed it for you.  If you do not have this equipment, it will be provided for you after surgery. 11. If you have not been contacted by the surgery center/hospital by the day before your surgery, call to confirm the date and time of your surgery. 12. Leave-time from work may vary depending on the type of surgery you have.  Appropriate arrangements should be made prior to surgery with your employer. 13. Prescriptions will be provided immediately following surgery by your doctor.  Fill these as soon as possible after surgery and take the medication as directed. Pain medications will not be refilled on weekends and must be approved by the doctor. 14. Remove nail polish on the operative foot and avoid getting pedicures prior to surgery. 15. Wash the night before surgery.  The night before surgery wash the foot and leg well with water and the antibacterial soap provided. Be sure to pay special attention to beneath the toenails and in between the toes.  Wash for at least three (3) minutes. Rinse thoroughly with water and dry well with a towel.  Perform this wash unless told not to do so by your physician.  Enclosed: 1 Ice pack (please put in freezer the night before surgery)   1 Hibiclens skin cleaner     Pre-op instructions  If you have any questions regarding the instructions, please do not hesitate to call our office.  [place]: [address], [place], [place] [address] -- [phone]  [place]: [address] [place], [place] [address] -- [phone]  [place]: [address]  [place], [place] [address] -- [phone]  [place]: [address], [address], [place], [place] [address] -- [phone]  Website:  [email or web address] 

## 2018-02-20 NOTE — Progress Notes (Signed)
  Subjective:  Patient ID: Holly [name], female    DOB: October 26, 1945,  MRN: [id number] HPI Chief Complaint  Patient presents with  . Toe Pain    5th toe bilateral (R>L) - tender medial sides x several months, feels callused sometimes, Dr. Elijah Birk said bone spurs, injected (no help) - didn't have a good experience at his office  . New Patient (Initial Visit)    72 y.o. female presents with the above complaint.   ROS: Denies fever chills nausea vomiting muscle aches pains back pain calf pain chest pain shortness of breath and headache.  No past medical history on file.   Current Outpatient Medications:  .  calcitonin, salmon, (MIACALCIN/FORTICAL) 200 UNIT/ACT nasal spray, daily. as directed, Disp: , Rfl: 11 .  simvastatin (ZOCOR) 40 MG tablet, Take 40 mg by mouth daily., Disp: , Rfl: 3  No Known Allergies Review of Systems Objective:   Vitals:   02/20/18 0929  BP: (!) 148/89  Pulse: 78  Resp: 16    General: Well developed, nourished, in no acute distress, alert and oriented x3   Dermatological: Skin is warm, dry and supple bilateral. Nails x 10 are well maintained; remaining integument appears unremarkable at this time. There are no open sores, no preulcerative lesions, no rash or signs of infection present.  Vascular: Dorsalis Pedis artery and Posterior Tibial artery pedal pulses are 2/4 bilateral with immedate capillary fill time. Pedal hair growth present. No varicosities and no lower extremity edema present bilateral.   Neruologic: Grossly intact via light touch bilateral. Vibratory intact via tuning fork bilateral. Protective threshold with Semmes Wienstein monofilament intact to all pedal sites bilateral. Patellar and Achilles deep tendon reflexes 2+ bilateral. No Babinski or clonus noted bilateral.   Musculoskeletal: No gross boney pedal deformities bilateral. No pain, crepitus, or limitation noted with foot and ankle range of motion bilateral. Muscular strength 5/5 in all  groups tested bilateral.  Gait: Unassisted, Nonantalgic.    Radiographs:  Discussed etiology pathology conservative versus surgical therapies.  At this point findings consistent with adductovarus rotated hammertoe deformities fifth bilateral with effusion of the intermediate and distal phalanges of the DIPJ.  This inability to mobilize these toes resulting in her pain.  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    Plan: Discussed etiology pathology conservative surgical therapies discussed derotational arthroplasties but there is bilateral and exostectomy medial aspect of digits bilaterally today.  She understands this is amenable to it signed all 3 pages a consent form.  Dispensed Darco shoes bilaterally we did discuss the possible postop complications which may include but not limited to postop pain bleeding swelling infection recurrence need for the surgery overcorrection under correction.     Christen Bedoya T. [place], [place]

## 2018-02-23 ENCOUNTER — Encounter: Payer: Self-pay | Admitting: Podiatry

## 2018-02-23 DIAGNOSIS — M25774 Osteophyte, right foot: Secondary | ICD-10-CM | POA: Diagnosis not present

## 2018-02-23 DIAGNOSIS — M2041 Other hammer toe(s) (acquired), right foot: Secondary | ICD-10-CM | POA: Diagnosis not present

## 2018-02-23 DIAGNOSIS — M2042 Other hammer toe(s) (acquired), left foot: Secondary | ICD-10-CM | POA: Diagnosis not present

## 2018-02-23 DIAGNOSIS — M25775 Osteophyte, left foot: Secondary | ICD-10-CM | POA: Diagnosis not present

## 2018-02-26 ENCOUNTER — Telehealth: Payer: Self-pay | Admitting: *Deleted

## 2018-02-26 NOTE — Telephone Encounter (Signed)
POST OP CALL-    1) General condition stated by the patient: PRETTY GOOD  2) Is the pt having pain? SOME  3) Pain score: 4  4) Has the pt taken Rx'd medication? PAIN MEDS AS NEEDED  5) Is the pain medication giving relief? YES  6) Any fever, chills, nausea, or vomiting? NO  7) Any shortness of breath or tightness in the calf? NO  8) Is the bandages clean, dry and intact? YES  9) Is the bandage excessively tight? NO   10) Is there excessive bleeding or drainage coming through the bandage? NO  11) Did you understand all of the post op instruction sheet given? YES  12) Any questions or concerns regarding post op care/recovery? ASKED ABOUT DRIVING, I ADVISED TO WAIT UNTIL FIRST POV AND SEE HOW SHE WAS DOING AND DR Al CorpusHYATT WOULD ADVISE WITH FURTHER INSTRUCTIONS    Confirmed POV appointment with patient

## 2018-03-01 ENCOUNTER — Ambulatory Visit (INDEPENDENT_AMBULATORY_CARE_PROVIDER_SITE_OTHER): Payer: Self-pay | Admitting: Podiatry

## 2018-03-01 ENCOUNTER — Ambulatory Visit (INDEPENDENT_AMBULATORY_CARE_PROVIDER_SITE_OTHER): Payer: Medicare Other

## 2018-03-01 VITALS — BP 138/85 | HR 87 | Temp 96.5°F

## 2018-03-01 DIAGNOSIS — M898X9 Other specified disorders of bone, unspecified site: Secondary | ICD-10-CM | POA: Diagnosis not present

## 2018-03-01 DIAGNOSIS — M2041 Other hammer toe(s) (acquired), right foot: Secondary | ICD-10-CM

## 2018-03-01 NOTE — Progress Notes (Signed)
She presents today date of surgery 02/23/2018 status post hammertoe repair fifth digits bilateral x1 week.  She denies fever chills nausea vomiting muscle aches and pains states that she is very happy with her toes they have not really bother her much at all.  Dry sterile dressing was removed today demonstrates no erythema edema saline strange odor sutures are intact margins well coapted radiographs demonstrate well healing arthroplasties.  Assessment: Well-healing surgical foot fifth bilateral.  Plan: Redressed today dressed a compressive dressing follow-up with her in 1 week for possible suture removal.

## 2018-03-08 ENCOUNTER — Ambulatory Visit (INDEPENDENT_AMBULATORY_CARE_PROVIDER_SITE_OTHER): Payer: Self-pay | Admitting: Podiatry

## 2018-03-08 ENCOUNTER — Encounter: Payer: Self-pay | Admitting: Podiatry

## 2018-03-08 DIAGNOSIS — M2041 Other hammer toe(s) (acquired), right foot: Secondary | ICD-10-CM

## 2018-03-08 DIAGNOSIS — M898X9 Other specified disorders of bone, unspecified site: Secondary | ICD-10-CM

## 2018-03-08 DIAGNOSIS — M2042 Other hammer toe(s) (acquired), left foot: Secondary | ICD-10-CM

## 2018-03-08 NOTE — Progress Notes (Signed)
She presents today for her second postop visit date of surgery Feb 23, 2018 hammertoe repair fifth digits bilateral with exostectomy.  She denies fever chills nausea vomiting muscle aches and pains states that my feet feel fine absolutely no pain whatsoever.  Objective: Vital signs are stable alert and oriented x3.  Pulses are palpable.  Neurologic sensorium is intact bilateral.  Sutures are intact margins well coapted removed all the sutures today placed a Coban dressing around the toe I will let her start getting this wet the next 12 hours or so.  She will continue the use of the Darco shoe for 2 more weeks I will follow-up with her.  Assessment: Well-healing surgical toes.  Plan: I will up with her in 2 more weeks

## 2018-03-22 ENCOUNTER — Encounter: Payer: Self-pay | Admitting: Podiatry

## 2018-03-22 ENCOUNTER — Ambulatory Visit (INDEPENDENT_AMBULATORY_CARE_PROVIDER_SITE_OTHER): Payer: Medicare Other | Admitting: Podiatry

## 2018-03-22 DIAGNOSIS — M2042 Other hammer toe(s) (acquired), left foot: Secondary | ICD-10-CM

## 2018-03-22 DIAGNOSIS — M2041 Other hammer toe(s) (acquired), right foot: Secondary | ICD-10-CM

## 2018-03-22 DIAGNOSIS — Z9889 Other specified postprocedural states: Secondary | ICD-10-CM

## 2018-03-22 DIAGNOSIS — M898X9 Other specified disorders of bone, unspecified site: Secondary | ICD-10-CM

## 2018-03-25 NOTE — Progress Notes (Signed)
She presents today for a follow-up of her hammertoe repair date of surgery 02/23/2018.  Fifth digits bilaterally with medial exostectomy's.  She states that they feel good discuss a little tingling when they get swollen but other than that they are doing wonderfully.  Objective: Vital signs are stable she is alert and oriented x3 there is no erythema some mild edema no cellulitis drainage or odor incisions abdominal to heal uneventfully she has good range of motion at the metatarsal phalangeal joint toes are sitting rectus in good position good alignment.  Assessment: Well-healing surgical toes fifth bilateral.  Plan: Follow-up with me on as-needed basis back into regular shoe gear

## 2018-04-19 LAB — GLUCOSE, POCT (MANUAL RESULT ENTRY): POC Glucose: 102 mg/dl — AB (ref 70–99)

## 2019-06-06 ENCOUNTER — Other Ambulatory Visit (HOSPITAL_COMMUNITY): Payer: Self-pay | Admitting: Pulmonary Disease

## 2019-06-06 DIAGNOSIS — M8580 Other specified disorders of bone density and structure, unspecified site: Secondary | ICD-10-CM

## 2019-06-06 LAB — CBC AND DIFFERENTIAL
HCT: 43 (ref 36–46)
Hemoglobin: 14 (ref 12.0–16.0)
Platelets: 273 (ref 150–399)
WBC: 6.1

## 2019-06-06 LAB — BASIC METABOLIC PANEL
BUN: 10 (ref 4–21)
Creatinine: 0.7 (ref <=1.1)
Glucose: 92
Potassium: 4.5 (ref 3.4–5.3)
Sodium: 144 (ref 137–147)

## 2019-06-06 LAB — HEPATIC FUNCTION PANEL
ALT: 8 (ref 7–35)
AST: 16 (ref 13–35)
Alkaline Phosphatase: 72 (ref 25–125)

## 2019-06-07 LAB — LIPID PANEL
Cholesterol: 159 (ref 0–200)
HDL: 74 — AB (ref 35–70)
LDL Cholesterol: 72
Triglycerides: 66 (ref 40–160)

## 2019-06-17 ENCOUNTER — Other Ambulatory Visit: Payer: Self-pay

## 2019-06-17 ENCOUNTER — Ambulatory Visit (HOSPITAL_COMMUNITY)
Admission: RE | Admit: 2019-06-17 | Discharge: 2019-06-17 | Disposition: A | Discharge status: Home / Self Care | Payer: Medicare Other | Source: Ambulatory Visit | Attending: Pulmonary Disease | Admitting: Pulmonary Disease

## 2019-06-17 DIAGNOSIS — M8580 Other specified disorders of bone density and structure, unspecified site: Secondary | ICD-10-CM | POA: Diagnosis present

## 2019-06-17 DIAGNOSIS — Z780 Asymptomatic menopausal state: Secondary | ICD-10-CM | POA: Diagnosis not present

## 2019-06-17 DIAGNOSIS — Z13820 Encounter for screening for osteoporosis: Secondary | ICD-10-CM | POA: Insufficient documentation

## 2019-06-17 LAB — HM DEXA SCAN

## 2019-06-25 LAB — COLOGUARD: Cologuard: NEGATIVE

## 2019-07-04 ENCOUNTER — Ambulatory Visit: Payer: Medicare Other | Admitting: Family Medicine

## 2019-07-04 ENCOUNTER — Encounter: Payer: Self-pay | Admitting: Family Medicine

## 2019-07-04 ENCOUNTER — Other Ambulatory Visit: Payer: Self-pay

## 2019-07-04 VITALS — BP 170/106 | HR 85 | Temp 98.0°F | Ht 61.0 in | Wt 150.4 lb

## 2019-07-04 DIAGNOSIS — M8580 Other specified disorders of bone density and structure, unspecified site: Secondary | ICD-10-CM | POA: Diagnosis not present

## 2019-07-04 DIAGNOSIS — M519 Unspecified thoracic, thoracolumbar and lumbosacral intervertebral disc disorder: Secondary | ICD-10-CM | POA: Insufficient documentation

## 2019-07-04 DIAGNOSIS — E785 Hyperlipidemia, unspecified: Secondary | ICD-10-CM | POA: Diagnosis not present

## 2019-07-04 NOTE — Progress Notes (Addendum)
New Patient Office Visit  Subjective:  Patient ID: Holly [name], female    DOB: June 19, 1946  Age: 73 y.o. MRN: [id number]  CC:  Chief Complaint  Patient presents with  . Establish Care    HPI BRYANAH SIDELL presents for DDD/bone spurs, cataracts   120's/80's-blood pressure at home HTLV-identified in the past-no symptoms-no hematology f/u  Past Medical History:  Diagnosis Date  . Arthritis   . Cataract   . Osteoporosis     History reviewed. No pertinent surgical history.  Family History  Problem Relation Age of Onset  . Cancer Mother   . Cancer Father   . Diabetes Sister   . Stroke Brother     Social History   Socioeconomic History  . Marital status: Widowed    Spouse name: Not on file  . Number of children: Not on file  . Years of education: Not on file  . Highest education level: Not on file  Occupational History  . Occupation: retired  Scientific laboratory technician  . Financial resource strain: Not on file  . Food insecurity    Worry: Not on file    Inability: Not on file  . Transportation needs    Medical: Not on file    Non-medical: Not on file  Tobacco Use  . Smoking status: Unknown If Ever Smoked  . Smokeless tobacco: Never Used  Substance and Sexual Activity  . Alcohol use: Never    Frequency: Never  . Drug use: Never  . Sexual activity: Yes  Lifestyle  . Physical activity    Days per week: Not on file    Minutes per session: Not on file  . Stress: Not on file  Relationships  . Social Herbalist on phone: Not on file    Gets together: Not on file    Attends religious service: Not on file    Active member of club or organization: Not on file    Attends meetings of clubs or organizations: Not on file    Relationship status: Not on file  . Intimate partner violence    Fear of current or ex partner: Not on file    Emotionally abused: Not on file    Physically abused: Not on file    Forced sexual activity: Not on file  Other Topics Concern   . Not on file  Social History Narrative  . Not on file    ROS Review of Systems  Constitutional: Negative.   HENT: Negative.   Eyes: Negative.        Cataract-bilat  Respiratory: Negative.        Asthma  Cardiovascular: Negative.   Gastrointestinal:       IBS  Endocrine: Negative.   Genitourinary: Negative.   Musculoskeletal: Negative.        Osteopenia Bone spurs-Spinal Surgery  Skin:       Derm -back of leg-yearly appt  Allergic/Immunologic: Negative.   Neurological: Negative.   Hematological:       Lymphotropic virus  Psychiatric/Behavioral: Negative.     Objective:   Today's Vitals: BP (!) 170/106 (BP Location: Left Arm, Patient Position: Sitting, Cuff Size: Normal)   Pulse 85   Temp 98 F (36.7 C) (Oral)   Ht 5\' 1"  (1.549 m)   Wt 150 lb 6.4 oz (68.2 kg)   SpO2 96%   BMI 28.42 kg/m   Physical Exam Constitutional:      Appearance: Normal appearance.  HENT:  Head: Normocephalic and atraumatic.     Right Ear: Tympanic membrane, ear canal and external ear normal.     Left Ear: Tympanic membrane, ear canal and external ear normal.     Nose: Nose normal.  Eyes:     Conjunctiva/sclera: Conjunctivae normal.  Neck:     Musculoskeletal: Normal range of motion and neck supple.  Cardiovascular:     Rate and Rhythm: Normal rate and regular rhythm.     Pulses: Normal pulses.     Heart sounds: Normal heart sounds.  Pulmonary:     Effort: Pulmonary effort is normal.     Breath sounds: Normal breath sounds.  Neurological:     Mental Status: She is alert and oriented to person, place, and time.  Psychiatric:        Mood and Affect: Mood normal.        Behavior: Behavior normal.   1. Osteopenia, unspecified location DEXA 9/20  2. Lumbar disc disease  3. Hyperlipidemia, unspecified hyperlipidemia type zocor-lipid panel normal 10/20 Assessment & Plan:  Lakeisha Waldrop Mat Carne, MD

## 2019-10-03 ENCOUNTER — Ambulatory Visit (INDEPENDENT_AMBULATORY_CARE_PROVIDER_SITE_OTHER): Payer: Medicare PPO | Admitting: Family Medicine

## 2019-10-03 ENCOUNTER — Other Ambulatory Visit: Payer: Self-pay

## 2019-10-03 VITALS — BP 134/85 | HR 88 | Temp 98.5°F | Ht 62.0 in | Wt 151.6 lb

## 2019-10-03 DIAGNOSIS — M519 Unspecified thoracic, thoracolumbar and lumbosacral intervertebral disc disorder: Secondary | ICD-10-CM | POA: Diagnosis not present

## 2019-10-03 MED ORDER — CYCLOBENZAPRINE HCL 10 MG PO TABS: ORAL_TABLET | ORAL | 1 refills | Status: AC

## 2019-10-03 MED ORDER — NAPROXEN 500 MG PO TABS: 500.0000 mg | ORAL_TABLET | Freq: Two times a day (BID) | ORAL | 0 refills | Status: DC

## 2019-10-03 NOTE — Progress Notes (Signed)
Acute Office Visit  Subjective:    Patient ID: INFANT Holly [name], female    DOB: 1946/01/24, 74 y.o.   MRN: [id number]  Chief Complaint  Patient presents with  . Groin Pain    L groin pain radiates down L leg x 2wks    HPI Patient is in today for leg pain-known DDD-lumbar. Pt does not take medication topical rubs. No change in urination or BM Able to walk up stairs and complete ADL-MRI 2010  Past Medical History:  Diagnosis Date  . Arthritis   . Cataract   . Osteoporosis     No past surgical history on file.  Family History  Problem Relation Age of Onset  . Cancer Mother   . Cancer Father   . Diabetes Sister   . Stroke Brother     Social History   Socioeconomic History  . Marital status: Widowed    Spouse name: Not on file  . Number of children: Not on file  . Years of education: Not on file  . Highest education level: Not on file  Occupational History  . Occupation: retired  Tobacco Use  . Smoking status: Unknown If Ever Smoked  . Smokeless tobacco: Never Used  Substance and Sexual Activity  . Alcohol use: Never  . Drug use: Never  . Sexual activity: Yes  Other Topics Concern  . Not on file  Social History Narrative  . Not on file   Social Determinants of Health   Financial Resource Strain:   . Difficulty of Paying Living Expenses: Not on file  Food Insecurity:   . Worried About Programme researcher, broadcasting/film/video in the Last Year: Not on file  . Ran Out of Food in the Last Year: Not on file  Transportation Needs:   . Lack of Transportation (Medical): Not on file  . Lack of Transportation (Non-Medical): Not on file  Physical Activity:   . Days of Exercise per Week: Not on file  . Minutes of Exercise per Session: Not on file  Stress:   . Feeling of Stress : Not on file  Social Connections:   . Frequency of Communication with Friends and Family: Not on file  . Frequency of Social Gatherings with Friends and Family: Not on file  . Attends Religious Services: Not  on file  . Active Member of Clubs or Organizations: Not on file  . Attends Banker Meetings: Not on file  . Marital Status: Not on file  Intimate Partner Violence:   . Fear of Current or Ex-Partner: Not on file  . Emotionally Abused: Not on file  . Physically Abused: Not on file  . Sexually Abused: Not on file    Outpatient Medications Prior to Visit  Medication Sig Dispense Refill  . Calcium Carbonate (CALCIUM 500 PO) Take 4 tablets by mouth daily.    . cholecalciferol (VITAMIN D3) 25 MCG (1000 UT) tablet Take 1,000 Units by mouth daily.    . calcitonin, salmon, (MIACALCIN/FORTICAL) 200 UNIT/ACT nasal spray daily. as directed  11  . simvastatin (ZOCOR) 40 MG tablet Take 40 mg by mouth daily.  3   No facility-administered medications prior to visit.    No Known Allergies  Review of Systems  Respiratory: Negative.   Gastrointestinal: Negative for blood in stool and constipation.  Genitourinary: Negative.   Musculoskeletal: Positive for arthralgias, back pain and myalgias. Negative for gait problem.       Objective:    Physical Exam  BP 134/85  Pulse 88   Temp 98.5 F (36.9 C)   Ht 5\' 2"  (1.575 m)   Wt 151 lb 9.6 oz (68.8 kg)   BMI 27.73 kg/m  Wt Readings from Last 3 Encounters:  10/03/19 151 lb 9.6 oz (68.8 kg)  07/04/19 150 lb 6.4 oz (68.2 kg)  01/08/15 130 lb (59 kg)    Health Maintenance Due  Topic Date Due  . MAMMOGRAM  10/02/1995  . COLONOSCOPY  10/02/1995  . PNA vac Low Risk Adult (2 of 2 - PCV13) 06/02/2019    There are no preventive care reminders to display for this patient.   No results found for: TSH Lab Results  Component Value Date   WBC 6.1 06/06/2019   HGB 14.0 06/06/2019   HCT 43 06/06/2019   MCV 83.1 03/06/2011   PLT 273 06/06/2019   Lab Results  Component Value Date   NA 144 06/06/2019   K 4.5 06/06/2019   CO2 23 03/06/2011   GLUCOSE 89 03/06/2011   BUN 10 06/06/2019   CREATININE 0.7 06/06/2019   BILITOT 0.4  03/03/2011   ALKPHOS 72 06/06/2019   AST 16 06/06/2019   ALT 8 06/06/2019   PROT 5.2 (L) 03/03/2011   ALBUMIN 2.1 (L) 03/03/2011   CALCIUM 7.7 (L) 03/06/2011   Lab Results  Component Value Date   CHOL 159 06/06/2019   Lab Results  Component Value Date   HDL 74 (A) 06/06/2019   Lab Results  Component Value Date   LDLCALC 72 06/06/2019   Lab Results  Component Value Date   TRIG 66 06/06/2019     Assessment & Plan:  1. Lumbar disc disease D/w nerve distribution-naprosyn/lfexeril  Pt declines MRI, stretches Kyasia Steuck Hannah Beat, MD

## 2019-10-03 NOTE — Patient Instructions (Signed)
stretches Back Exercises These exercises help to make your trunk and back strong. They also help to keep the lower back flexible. Doing these exercises can help to prevent back pain or lessen existing pain.  If you have back pain, try to do these exercises 2-3 times each day or as told by your doctor.  As you get better, do the exercises once each day. Repeat the exercises more often as told by your doctor.  To stop back pain from coming back, do the exercises once each day, or as told by your doctor. Exercises Single knee to chest Do these steps 3-5 times in a row for each leg: 1. Lie on your back on a firm bed or the floor with your legs stretched out. 2. Bring one knee to your chest. 3. Grab your knee or thigh with both hands and hold them it in place. 4. Pull on your knee until you feel a gentle stretch in your lower back or buttocks. 5. Keep doing the stretch for 10-30 seconds. 6. Slowly let go of your leg and straighten it. Pelvic tilt Do these steps 5-10 times in a row: 1. Lie on your back on a firm bed or the floor with your legs stretched out. 2. Bend your knees so they point up to the ceiling. Your feet should be flat on the floor. 3. Tighten your lower belly (abdomen) muscles to press your lower back against the floor. This will make your tailbone point up to the ceiling instead of pointing down to your feet or the floor. 4. Stay in this position for 5-10 seconds while you gently tighten your muscles and breathe evenly. Cat-cow Do these steps until your lower back bends more easily: 1. Get on your hands and knees on a firm surface. Keep your hands under your shoulders, and keep your knees under your hips. You may put padding under your knees. 2. Let your head hang down toward your chest. Tighten (contract) the muscles in your belly. Point your tailbone toward the floor so your lower back becomes rounded like the back of a cat. 3. Stay in this position for 5 seconds. 4. Slowly  lift your head. Let the muscles of your belly relax. Point your tailbone up toward the ceiling so your back forms a sagging arch like the back of a cow. 5. Stay in this position for 5 seconds.  Press-ups Do these steps 5-10 times in a row: 1. Lie on your belly (face-down) on the floor. 2. Place your hands near your head, about shoulder-width apart. 3. While you keep your back relaxed and keep your hips on the floor, slowly straighten your arms to raise the top half of your body and lift your shoulders. Do not use your back muscles. You may change where you place your hands in order to make yourself more comfortable. 4. Stay in this position for 5 seconds. 5. Slowly return to lying flat on the floor.  Bridges Do these steps 10 times in a row: 1. Lie on your back on a firm surface. 2. Bend your knees so they point up to the ceiling. Your feet should be flat on the floor. Your arms should be flat at your sides, next to your body. 3. Tighten your butt muscles and lift your butt off the floor until your waist is almost as high as your knees. If you do not feel the muscles working in your butt and the back of your thighs, slide your feet 1-2  inches farther away from your butt. 4. Stay in this position for 3-5 seconds. 5. Slowly lower your butt to the floor, and let your butt muscles relax. If this exercise is too easy, try doing it with your arms crossed over your chest. Belly crunches Do these steps 5-10 times in a row: 1. Lie on your back on a firm bed or the floor with your legs stretched out. 2. Bend your knees so they point up to the ceiling. Your feet should be flat on the floor. 3. Cross your arms over your chest. 4. Tip your chin a little bit toward your chest but do not bend your neck. 5. Tighten your belly muscles and slowly raise your chest just enough to lift your shoulder blades a tiny bit off of the floor. Avoid raising your body higher than that, because it can put too much stress  on your low back. 6. Slowly lower your chest and your head to the floor. Back lifts Do these steps 5-10 times in a row: 1. Lie on your belly (face-down) with your arms at your sides, and rest your forehead on the floor. 2. Tighten the muscles in your legs and your butt. 3. Slowly lift your chest off of the floor while you keep your hips on the floor. Keep the back of your head in line with the curve in your back. Look at the floor while you do this. 4. Stay in this position for 3-5 seconds. 5. Slowly lower your chest and your face to the floor. Contact a doctor if:  Your back pain gets a lot worse when you do an exercise.  Your back pain does not get better 2 hours after you exercise. If you have any of these problems, stop doing the exercises. Do not do them again unless your doctor says it is okay. Get help right away if:  You have sudden, very bad back pain. If this happens, stop doing the exercises. Do not do them again unless your doctor says it is okay. This information is not intended to replace advice given to you by your health care provider. Make sure you discuss any questions you have with your health care provider. Document Revised: 06/07/2018 Document Reviewed: 06/07/2018 [employer] Patient Education  2020 [employer]  Adductor Muscle Strain  An adductor muscle strain, also called a groin strain or pull, is an injury to the muscles or tendons on the upper, inner part of the thigh. These muscles are called the adductor muscles or groin muscles. They are responsible for moving the legs across the body or pulling the legs together. A muscle strain occurs when a muscle is overstretched and some muscle fibers are torn. An adductor muscle strain can range from mild to severe, depending on how many muscle fibers are affected and whether the muscle fibers are partially or completely torn. What are the causes? Adductor muscle strains usually occur during exercise or while  participating in sports. The injury often happens when a sudden, violent force is placed on a muscle, stretching the muscle too far. A strain is more likely to happen when your muscles are not warmed up or if you are not properly conditioned. This injury may be caused by:  Stretching the adductor muscles too far or too suddenly, often during side-to-side motion with a sudden change in direction.  Putting repeated stress on the adductor muscles over a long period of time.  Performing vigorous activity without properly stretching the adductor muscles beforehand. What  are the signs or symptoms? Symptoms of this condition include:  Pain and tenderness in the groin area. This begins as sharp pain and persists as a dull ache.  A popping or snapping feeling when the injury occurs (for severe strains).  Swelling or bruising.  Muscle spasms.  Weakness in the leg.  Stiffness in the groin area with decreased ability to move the affected muscles. How is this diagnosed? This condition may be diagnosed based on:  Your symptoms and a description of how the injury occurred.  A physical exam.  Imaging tests, such as: ? X-rays. These are sometimes needed to rule out a broken bone or cartilage problems. ? An ultrasound, CT scan, or MRI. These may be done if your health care provider suspects a complete muscle tear or needs to check for other injuries. How is this treated? An adductor strain will often heal on its own. If needed, this condition may be treated with:  PRICE therapy. PRICE stands for protection of the injured area, rest, ice, pressure (compression), and elevation.  Medicines to help manage pain and swelling (anti-inflammatory medicines).  Crutches. You may be directed to use these for the first few days to minimize your pain. Depending on the severity of the muscle strain, recovery time may vary from a few weeks to several months. Severe injuries often require 4-6 weeks for  recovery. In those cases, complete healing can take 4-5 months. Follow these instructions at home: PRICE Therapy   Protect the muscle from being injured again.  Rest. Do not use the strained muscle if it causes pain.  If directed, put ice on the injured area: ? Put ice in a plastic bag. ? Place a towel between your skin and the bag. ? Leave the ice on for 20 minutes, 2-3 times a day. Do this for the first 2 days after the injury.  Apply compression by wrapping the injured area with an elastic bandage as told by your health care provider.  Raise (elevate) the injured area above the level of your heart while you are sitting or lying down. General instructions  Take over-the-counter and prescription medicines only as told by your health care provider.  Walk, stretch, and do exercises as told by your health care provider. Only do these activities if you can do so without any pain.  Follow your treatment plan as told by your health care provider. This may include: ? Physical therapy. ? Massage. ? Local electrical stimulation (transcutaneous electrical nerve stimulation, TENS). How is this prevented?  Warm up and stretch before being active.  Cool down and stretch after being active.  Give your body time to rest between periods of activity.  Make sure to use equipment that fits you.  Be safe and responsible while being active to avoid slips and falls.  Maintain physical fitness, including: ? Proper conditioning in the adductor muscles. ? Overall strength, flexibility, and endurance. Contact a health care provider if:  You have increased pain or swelling in the affected area.  Your symptoms are not improving or they are getting worse. Summary  An adductor muscle strain, also called a groin strain or pull, is an injury to the muscles or tendons on the upper, inner part of the thigh.  A muscle strain occurs when a muscle is overstretched and some muscle fibers are  torn.  Depending on the severity of the muscle strain, recovery time may vary from a few weeks to several months. This information is not intended to  replace advice given to you by your health care provider. Make sure you discuss any questions you have with your health care provider. Document Revised: 01/01/2019 Document Reviewed: 02/12/2018 [employer] Patient Education  [place].

## 2019-10-15 ENCOUNTER — Other Ambulatory Visit: Payer: Self-pay | Admitting: Family Medicine

## 2019-10-27 ENCOUNTER — Other Ambulatory Visit: Payer: Self-pay | Admitting: Family Medicine

## 2019-11-11 ENCOUNTER — Other Ambulatory Visit: Payer: Self-pay | Admitting: Emergency Medicine

## 2019-11-11 DIAGNOSIS — M519 Unspecified thoracic, thoracolumbar and lumbosacral intervertebral disc disorder: Secondary | ICD-10-CM

## 2019-11-11 MED ORDER — NAPROXEN 500 MG PO TABS: ORAL_TABLET | ORAL | 0 refills | Status: DC

## 2019-11-28 ENCOUNTER — Other Ambulatory Visit: Payer: Self-pay | Admitting: Family Medicine

## 2019-11-28 DIAGNOSIS — M519 Unspecified thoracic, thoracolumbar and lumbosacral intervertebral disc disorder: Secondary | ICD-10-CM

## 2019-11-28 MED ORDER — NAPROXEN 500 MG PO TABS: ORAL_TABLET | ORAL | 1 refills | Status: DC

## 2019-12-23 ENCOUNTER — Other Ambulatory Visit: Payer: Self-pay | Admitting: Family Medicine

## 2019-12-23 DIAGNOSIS — M519 Unspecified thoracic, thoracolumbar and lumbosacral intervertebral disc disorder: Secondary | ICD-10-CM

## 2019-12-23 NOTE — Telephone Encounter (Signed)
Please advise on this refill  

## 2020-01-02 DIAGNOSIS — Z0189 Encounter for other specified special examinations: Secondary | ICD-10-CM | POA: Diagnosis not present

## 2020-01-02 DIAGNOSIS — M79605 Pain in left leg: Secondary | ICD-10-CM | POA: Diagnosis not present

## 2020-01-02 DIAGNOSIS — E785 Hyperlipidemia, unspecified: Secondary | ICD-10-CM | POA: Diagnosis not present

## 2020-01-02 DIAGNOSIS — E559 Vitamin D deficiency, unspecified: Secondary | ICD-10-CM | POA: Diagnosis not present

## 2020-01-02 DIAGNOSIS — M8580 Other specified disorders of bone density and structure, unspecified site: Secondary | ICD-10-CM | POA: Diagnosis not present

## 2020-01-14 DIAGNOSIS — E559 Vitamin D deficiency, unspecified: Secondary | ICD-10-CM | POA: Diagnosis not present

## 2020-01-14 DIAGNOSIS — E785 Hyperlipidemia, unspecified: Secondary | ICD-10-CM | POA: Diagnosis not present

## 2020-01-24 DIAGNOSIS — M8580 Other specified disorders of bone density and structure, unspecified site: Secondary | ICD-10-CM | POA: Diagnosis not present

## 2020-01-24 DIAGNOSIS — E785 Hyperlipidemia, unspecified: Secondary | ICD-10-CM | POA: Diagnosis not present

## 2020-01-24 DIAGNOSIS — E559 Vitamin D deficiency, unspecified: Secondary | ICD-10-CM | POA: Diagnosis not present

## 2020-04-07 DIAGNOSIS — H35033 Hypertensive retinopathy, bilateral: Secondary | ICD-10-CM | POA: Diagnosis not present

## 2020-05-08 DIAGNOSIS — Z1231 Encounter for screening mammogram for malignant neoplasm of breast: Secondary | ICD-10-CM | POA: Diagnosis not present

## 2020-05-08 DIAGNOSIS — Z01419 Encounter for gynecological examination (general) (routine) without abnormal findings: Secondary | ICD-10-CM | POA: Diagnosis not present

## 2020-05-08 DIAGNOSIS — Z124 Encounter for screening for malignant neoplasm of cervix: Secondary | ICD-10-CM | POA: Diagnosis not present

## 2020-07-06 ENCOUNTER — Ambulatory Visit: Payer: Medicare Other | Admitting: Family Medicine

## 2020-12-02 DIAGNOSIS — K581 Irritable bowel syndrome with constipation: Secondary | ICD-10-CM | POA: Diagnosis not present

## 2020-12-02 DIAGNOSIS — R1030 Lower abdominal pain, unspecified: Secondary | ICD-10-CM | POA: Diagnosis not present

## 2021-02-11 DIAGNOSIS — E559 Vitamin D deficiency, unspecified: Secondary | ICD-10-CM | POA: Diagnosis not present

## 2021-02-11 DIAGNOSIS — E785 Hyperlipidemia, unspecified: Secondary | ICD-10-CM | POA: Diagnosis not present

## 2021-02-15 DIAGNOSIS — M545 Low back pain, unspecified: Secondary | ICD-10-CM | POA: Diagnosis not present

## 2021-02-15 DIAGNOSIS — E782 Mixed hyperlipidemia: Secondary | ICD-10-CM | POA: Diagnosis not present

## 2021-02-15 DIAGNOSIS — B9733 Human T-cell lymphotrophic virus, type I [HTLV-I] as the cause of diseases classified elsewhere: Secondary | ICD-10-CM | POA: Diagnosis not present

## 2021-02-15 DIAGNOSIS — K580 Irritable bowel syndrome with diarrhea: Secondary | ICD-10-CM | POA: Diagnosis not present

## 2021-02-15 DIAGNOSIS — M8580 Other specified disorders of bone density and structure, unspecified site: Secondary | ICD-10-CM | POA: Diagnosis not present

## 2021-02-26 DIAGNOSIS — M549 Dorsalgia, unspecified: Secondary | ICD-10-CM | POA: Diagnosis not present

## 2021-02-26 DIAGNOSIS — M47816 Spondylosis without myelopathy or radiculopathy, lumbar region: Secondary | ICD-10-CM | POA: Diagnosis not present

## 2021-03-01 DIAGNOSIS — M4305 Spondylolysis, thoracolumbar region: Secondary | ICD-10-CM | POA: Diagnosis not present

## 2021-03-04 DIAGNOSIS — M545 Low back pain, unspecified: Secondary | ICD-10-CM | POA: Diagnosis not present

## 2021-03-04 DIAGNOSIS — M47816 Spondylosis without myelopathy or radiculopathy, lumbar region: Secondary | ICD-10-CM | POA: Diagnosis not present

## 2021-03-05 DIAGNOSIS — M4305 Spondylolysis, thoracolumbar region: Secondary | ICD-10-CM | POA: Diagnosis not present

## 2021-03-08 DIAGNOSIS — M4305 Spondylolysis, thoracolumbar region: Secondary | ICD-10-CM | POA: Diagnosis not present

## 2021-03-11 DIAGNOSIS — M4305 Spondylolysis, thoracolumbar region: Secondary | ICD-10-CM | POA: Diagnosis not present

## 2021-03-15 DIAGNOSIS — M4305 Spondylolysis, thoracolumbar region: Secondary | ICD-10-CM | POA: Diagnosis not present

## 2021-03-16 DIAGNOSIS — M47816 Spondylosis without myelopathy or radiculopathy, lumbar region: Secondary | ICD-10-CM | POA: Diagnosis not present

## 2021-03-18 DIAGNOSIS — M4305 Spondylolysis, thoracolumbar region: Secondary | ICD-10-CM | POA: Diagnosis not present

## 2021-03-22 DIAGNOSIS — M4305 Spondylolysis, thoracolumbar region: Secondary | ICD-10-CM | POA: Diagnosis not present

## 2021-03-25 DIAGNOSIS — M545 Low back pain, unspecified: Secondary | ICD-10-CM | POA: Diagnosis not present

## 2021-03-25 DIAGNOSIS — R030 Elevated blood-pressure reading, without diagnosis of hypertension: Secondary | ICD-10-CM | POA: Diagnosis not present

## 2021-03-25 DIAGNOSIS — M4305 Spondylolysis, thoracolumbar region: Secondary | ICD-10-CM | POA: Diagnosis not present

## 2021-04-01 DIAGNOSIS — M4305 Spondylolysis, thoracolumbar region: Secondary | ICD-10-CM | POA: Diagnosis not present

## 2021-04-02 DIAGNOSIS — M4305 Spondylolysis, thoracolumbar region: Secondary | ICD-10-CM | POA: Diagnosis not present

## 2021-04-05 DIAGNOSIS — M4305 Spondylolysis, thoracolumbar region: Secondary | ICD-10-CM | POA: Diagnosis not present

## 2021-04-05 DIAGNOSIS — Z20822 Contact with and (suspected) exposure to covid-19: Secondary | ICD-10-CM | POA: Diagnosis not present

## 2021-04-05 DIAGNOSIS — U071 COVID-19: Secondary | ICD-10-CM | POA: Diagnosis not present

## 2021-04-14 DIAGNOSIS — M4305 Spondylolysis, thoracolumbar region: Secondary | ICD-10-CM | POA: Diagnosis not present

## 2021-04-15 DIAGNOSIS — M4305 Spondylolysis, thoracolumbar region: Secondary | ICD-10-CM | POA: Diagnosis not present

## 2021-04-19 DIAGNOSIS — M4305 Spondylolysis, thoracolumbar region: Secondary | ICD-10-CM | POA: Diagnosis not present

## 2021-05-18 DIAGNOSIS — Z01419 Encounter for gynecological examination (general) (routine) without abnormal findings: Secondary | ICD-10-CM | POA: Diagnosis not present

## 2021-06-29 DIAGNOSIS — Z809 Family history of malignant neoplasm, unspecified: Secondary | ICD-10-CM | POA: Diagnosis not present

## 2021-06-29 DIAGNOSIS — E785 Hyperlipidemia, unspecified: Secondary | ICD-10-CM | POA: Diagnosis not present

## 2021-06-29 DIAGNOSIS — Z20822 Contact with and (suspected) exposure to covid-19: Secondary | ICD-10-CM | POA: Diagnosis not present

## 2021-06-29 DIAGNOSIS — M810 Age-related osteoporosis without current pathological fracture: Secondary | ICD-10-CM | POA: Diagnosis not present

## 2021-06-29 DIAGNOSIS — Z885 Allergy status to narcotic agent status: Secondary | ICD-10-CM | POA: Diagnosis not present

## 2021-08-18 DIAGNOSIS — E782 Mixed hyperlipidemia: Secondary | ICD-10-CM | POA: Diagnosis not present

## 2021-08-18 DIAGNOSIS — M8580 Other specified disorders of bone density and structure, unspecified site: Secondary | ICD-10-CM | POA: Diagnosis not present

## 2021-08-23 ENCOUNTER — Other Ambulatory Visit (HOSPITAL_COMMUNITY): Payer: Self-pay | Admitting: Internal Medicine

## 2021-08-23 DIAGNOSIS — Z0001 Encounter for general adult medical examination with abnormal findings: Secondary | ICD-10-CM | POA: Diagnosis not present

## 2021-08-23 DIAGNOSIS — M8580 Other specified disorders of bone density and structure, unspecified site: Secondary | ICD-10-CM

## 2021-08-23 DIAGNOSIS — B9733 Human T-cell lymphotrophic virus, type I [HTLV-I] as the cause of diseases classified elsewhere: Secondary | ICD-10-CM | POA: Diagnosis not present

## 2021-08-23 DIAGNOSIS — M545 Low back pain, unspecified: Secondary | ICD-10-CM | POA: Diagnosis not present

## 2021-08-23 DIAGNOSIS — E782 Mixed hyperlipidemia: Secondary | ICD-10-CM | POA: Diagnosis not present

## 2021-08-23 DIAGNOSIS — R030 Elevated blood-pressure reading, without diagnosis of hypertension: Secondary | ICD-10-CM | POA: Diagnosis not present

## 2021-08-23 DIAGNOSIS — K580 Irritable bowel syndrome with diarrhea: Secondary | ICD-10-CM | POA: Diagnosis not present

## 2021-08-23 DIAGNOSIS — B372 Candidiasis of skin and nail: Secondary | ICD-10-CM | POA: Diagnosis not present

## 2021-08-23 DIAGNOSIS — Z23 Encounter for immunization: Secondary | ICD-10-CM | POA: Diagnosis not present

## 2021-08-30 ENCOUNTER — Ambulatory Visit (HOSPITAL_COMMUNITY)
Admission: RE | Admit: 2021-08-30 | Discharge: 2021-08-30 | Disposition: A | Discharge status: Home / Self Care | Payer: Medicare PPO | Source: Ambulatory Visit | Attending: Internal Medicine | Admitting: Internal Medicine

## 2021-08-30 ENCOUNTER — Other Ambulatory Visit: Payer: Self-pay

## 2021-08-30 DIAGNOSIS — Z780 Asymptomatic menopausal state: Secondary | ICD-10-CM | POA: Diagnosis not present

## 2021-08-30 DIAGNOSIS — Z13820 Encounter for screening for osteoporosis: Secondary | ICD-10-CM | POA: Insufficient documentation

## 2021-08-30 DIAGNOSIS — M8589 Other specified disorders of bone density and structure, multiple sites: Secondary | ICD-10-CM | POA: Diagnosis not present

## 2021-08-30 DIAGNOSIS — M8580 Other specified disorders of bone density and structure, unspecified site: Secondary | ICD-10-CM

## 2021-09-01 DIAGNOSIS — L570 Actinic keratosis: Secondary | ICD-10-CM | POA: Diagnosis not present

## 2021-09-01 DIAGNOSIS — Z1283 Encounter for screening for malignant neoplasm of skin: Secondary | ICD-10-CM | POA: Diagnosis not present

## 2021-09-01 DIAGNOSIS — L309 Dermatitis, unspecified: Secondary | ICD-10-CM | POA: Diagnosis not present

## 2021-09-01 DIAGNOSIS — D239 Other benign neoplasm of skin, unspecified: Secondary | ICD-10-CM | POA: Diagnosis not present

## 2021-10-04 DIAGNOSIS — Z1231 Encounter for screening mammogram for malignant neoplasm of breast: Secondary | ICD-10-CM | POA: Diagnosis not present

## 2021-10-05 DIAGNOSIS — E785 Hyperlipidemia, unspecified: Secondary | ICD-10-CM | POA: Diagnosis not present

## 2021-10-05 DIAGNOSIS — Z809 Family history of malignant neoplasm, unspecified: Secondary | ICD-10-CM | POA: Diagnosis not present

## 2021-10-05 DIAGNOSIS — G8929 Other chronic pain: Secondary | ICD-10-CM | POA: Diagnosis not present

## 2021-10-05 DIAGNOSIS — Z833 Family history of diabetes mellitus: Secondary | ICD-10-CM | POA: Diagnosis not present

## 2021-10-05 DIAGNOSIS — M810 Age-related osteoporosis without current pathological fracture: Secondary | ICD-10-CM | POA: Diagnosis not present

## 2021-10-05 DIAGNOSIS — R000 Tachycardia, unspecified: Secondary | ICD-10-CM | POA: Diagnosis not present

## 2021-10-05 DIAGNOSIS — M8580 Other specified disorders of bone density and structure, unspecified site: Secondary | ICD-10-CM | POA: Diagnosis not present

## 2021-10-05 DIAGNOSIS — Z8249 Family history of ischemic heart disease and other diseases of the circulatory system: Secondary | ICD-10-CM | POA: Diagnosis not present

## 2021-10-05 DIAGNOSIS — R030 Elevated blood-pressure reading, without diagnosis of hypertension: Secondary | ICD-10-CM | POA: Diagnosis not present

## 2021-10-18 DIAGNOSIS — K219 Gastro-esophageal reflux disease without esophagitis: Secondary | ICD-10-CM | POA: Diagnosis not present

## 2021-11-30 DIAGNOSIS — M542 Cervicalgia: Secondary | ICD-10-CM | POA: Diagnosis not present

## 2022-02-17 DIAGNOSIS — E782 Mixed hyperlipidemia: Secondary | ICD-10-CM | POA: Diagnosis not present

## 2022-02-22 DIAGNOSIS — B9733 Human T-cell lymphotrophic virus, type I [HTLV-I] as the cause of diseases classified elsewhere: Secondary | ICD-10-CM | POA: Diagnosis not present

## 2022-02-22 DIAGNOSIS — E782 Mixed hyperlipidemia: Secondary | ICD-10-CM | POA: Diagnosis not present

## 2022-02-22 DIAGNOSIS — M545 Low back pain, unspecified: Secondary | ICD-10-CM | POA: Diagnosis not present

## 2022-02-22 DIAGNOSIS — K580 Irritable bowel syndrome with diarrhea: Secondary | ICD-10-CM | POA: Diagnosis not present

## 2022-02-22 DIAGNOSIS — K219 Gastro-esophageal reflux disease without esophagitis: Secondary | ICD-10-CM | POA: Diagnosis not present

## 2022-02-22 DIAGNOSIS — R030 Elevated blood-pressure reading, without diagnosis of hypertension: Secondary | ICD-10-CM | POA: Diagnosis not present

## 2022-02-22 DIAGNOSIS — M8580 Other specified disorders of bone density and structure, unspecified site: Secondary | ICD-10-CM | POA: Diagnosis not present

## 2022-03-07 DIAGNOSIS — J069 Acute upper respiratory infection, unspecified: Secondary | ICD-10-CM | POA: Diagnosis not present

## 2022-08-17 DIAGNOSIS — E782 Mixed hyperlipidemia: Secondary | ICD-10-CM | POA: Diagnosis not present

## 2022-08-25 DIAGNOSIS — I10 Essential (primary) hypertension: Secondary | ICD-10-CM | POA: Diagnosis not present

## 2022-08-25 DIAGNOSIS — K580 Irritable bowel syndrome with diarrhea: Secondary | ICD-10-CM | POA: Diagnosis not present

## 2022-08-25 DIAGNOSIS — B9733 Human T-cell lymphotrophic virus, type I [HTLV-I] as the cause of diseases classified elsewhere: Secondary | ICD-10-CM | POA: Diagnosis not present

## 2022-08-25 DIAGNOSIS — M545 Low back pain, unspecified: Secondary | ICD-10-CM | POA: Diagnosis not present

## 2022-08-25 DIAGNOSIS — K219 Gastro-esophageal reflux disease without esophagitis: Secondary | ICD-10-CM | POA: Diagnosis not present

## 2022-08-25 DIAGNOSIS — M25562 Pain in left knee: Secondary | ICD-10-CM | POA: Diagnosis not present

## 2022-08-25 DIAGNOSIS — M8580 Other specified disorders of bone density and structure, unspecified site: Secondary | ICD-10-CM | POA: Diagnosis not present

## 2022-08-25 DIAGNOSIS — E782 Mixed hyperlipidemia: Secondary | ICD-10-CM | POA: Diagnosis not present

## 2022-08-25 DIAGNOSIS — Z23 Encounter for immunization: Secondary | ICD-10-CM | POA: Diagnosis not present

## 2022-09-27 DIAGNOSIS — H35362 Drusen (degenerative) of macula, left eye: Secondary | ICD-10-CM | POA: Diagnosis not present

## 2022-10-10 DIAGNOSIS — L570 Actinic keratosis: Secondary | ICD-10-CM | POA: Diagnosis not present

## 2022-10-10 DIAGNOSIS — L280 Lichen simplex chronicus: Secondary | ICD-10-CM | POA: Diagnosis not present

## 2022-10-10 DIAGNOSIS — D239 Other benign neoplasm of skin, unspecified: Secondary | ICD-10-CM | POA: Diagnosis not present

## 2022-10-10 DIAGNOSIS — Z1283 Encounter for screening for malignant neoplasm of skin: Secondary | ICD-10-CM | POA: Diagnosis not present

## 2023-01-16 DIAGNOSIS — M25562 Pain in left knee: Secondary | ICD-10-CM | POA: Diagnosis not present

## 2023-01-16 DIAGNOSIS — G8929 Other chronic pain: Secondary | ICD-10-CM | POA: Diagnosis not present

## 2023-02-17 DIAGNOSIS — R7301 Impaired fasting glucose: Secondary | ICD-10-CM | POA: Diagnosis not present

## 2023-02-17 DIAGNOSIS — E782 Mixed hyperlipidemia: Secondary | ICD-10-CM | POA: Diagnosis not present

## 2023-02-22 DIAGNOSIS — Z0000 Encounter for general adult medical examination without abnormal findings: Secondary | ICD-10-CM | POA: Diagnosis not present

## 2023-02-23 DIAGNOSIS — I10 Essential (primary) hypertension: Secondary | ICD-10-CM | POA: Diagnosis not present

## 2023-02-23 DIAGNOSIS — M8580 Other specified disorders of bone density and structure, unspecified site: Secondary | ICD-10-CM | POA: Diagnosis not present

## 2023-02-23 DIAGNOSIS — K580 Irritable bowel syndrome with diarrhea: Secondary | ICD-10-CM | POA: Diagnosis not present

## 2023-02-23 DIAGNOSIS — M545 Low back pain, unspecified: Secondary | ICD-10-CM | POA: Diagnosis not present

## 2023-02-23 DIAGNOSIS — E782 Mixed hyperlipidemia: Secondary | ICD-10-CM | POA: Diagnosis not present

## 2023-02-23 DIAGNOSIS — M25562 Pain in left knee: Secondary | ICD-10-CM | POA: Diagnosis not present

## 2023-02-23 DIAGNOSIS — K219 Gastro-esophageal reflux disease without esophagitis: Secondary | ICD-10-CM | POA: Diagnosis not present

## 2023-02-23 DIAGNOSIS — B9733 Human T-cell lymphotrophic virus, type I [HTLV-I] as the cause of diseases classified elsewhere: Secondary | ICD-10-CM | POA: Diagnosis not present

## 2023-02-23 DIAGNOSIS — Z23 Encounter for immunization: Secondary | ICD-10-CM | POA: Diagnosis not present

## 2023-03-03 DIAGNOSIS — I129 Hypertensive chronic kidney disease with stage 1 through stage 4 chronic kidney disease, or unspecified chronic kidney disease: Secondary | ICD-10-CM | POA: Diagnosis not present

## 2023-03-03 DIAGNOSIS — K219 Gastro-esophageal reflux disease without esophagitis: Secondary | ICD-10-CM | POA: Diagnosis not present

## 2023-03-03 DIAGNOSIS — M545 Low back pain, unspecified: Secondary | ICD-10-CM | POA: Diagnosis not present

## 2023-03-03 DIAGNOSIS — K589 Irritable bowel syndrome without diarrhea: Secondary | ICD-10-CM | POA: Diagnosis not present

## 2023-03-03 DIAGNOSIS — E785 Hyperlipidemia, unspecified: Secondary | ICD-10-CM | POA: Diagnosis not present

## 2023-03-03 DIAGNOSIS — Z8249 Family history of ischemic heart disease and other diseases of the circulatory system: Secondary | ICD-10-CM | POA: Diagnosis not present

## 2023-03-03 DIAGNOSIS — G8929 Other chronic pain: Secondary | ICD-10-CM | POA: Diagnosis not present

## 2023-03-03 DIAGNOSIS — N182 Chronic kidney disease, stage 2 (mild): Secondary | ICD-10-CM | POA: Diagnosis not present

## 2023-03-03 DIAGNOSIS — M810 Age-related osteoporosis without current pathological fracture: Secondary | ICD-10-CM | POA: Diagnosis not present

## 2023-03-07 DIAGNOSIS — Z1211 Encounter for screening for malignant neoplasm of colon: Secondary | ICD-10-CM | POA: Diagnosis not present

## 2023-08-07 DIAGNOSIS — S161XXA Strain of muscle, fascia and tendon at neck level, initial encounter: Secondary | ICD-10-CM | POA: Diagnosis not present

## 2023-08-16 DIAGNOSIS — M8580 Other specified disorders of bone density and structure, unspecified site: Secondary | ICD-10-CM | POA: Diagnosis not present

## 2023-08-16 DIAGNOSIS — R7301 Impaired fasting glucose: Secondary | ICD-10-CM | POA: Diagnosis not present

## 2023-08-16 DIAGNOSIS — E782 Mixed hyperlipidemia: Secondary | ICD-10-CM | POA: Diagnosis not present

## 2023-08-22 DIAGNOSIS — S161XXD Strain of muscle, fascia and tendon at neck level, subsequent encounter: Secondary | ICD-10-CM | POA: Diagnosis not present

## 2023-08-22 DIAGNOSIS — B9733 Human T-cell lymphotrophic virus, type I [HTLV-I] as the cause of diseases classified elsewhere: Secondary | ICD-10-CM | POA: Diagnosis not present

## 2023-08-22 DIAGNOSIS — E782 Mixed hyperlipidemia: Secondary | ICD-10-CM | POA: Diagnosis not present

## 2023-08-22 DIAGNOSIS — S161XXA Strain of muscle, fascia and tendon at neck level, initial encounter: Secondary | ICD-10-CM | POA: Diagnosis not present

## 2023-08-22 DIAGNOSIS — M8580 Other specified disorders of bone density and structure, unspecified site: Secondary | ICD-10-CM | POA: Diagnosis not present

## 2023-08-22 DIAGNOSIS — M545 Low back pain, unspecified: Secondary | ICD-10-CM | POA: Diagnosis not present

## 2023-08-22 DIAGNOSIS — Z0000 Encounter for general adult medical examination without abnormal findings: Secondary | ICD-10-CM | POA: Diagnosis not present

## 2023-08-22 DIAGNOSIS — Z23 Encounter for immunization: Secondary | ICD-10-CM | POA: Diagnosis not present

## 2023-08-22 DIAGNOSIS — I10 Essential (primary) hypertension: Secondary | ICD-10-CM | POA: Diagnosis not present

## 2023-09-14 DIAGNOSIS — M85852 Other specified disorders of bone density and structure, left thigh: Secondary | ICD-10-CM | POA: Diagnosis not present

## 2023-09-14 DIAGNOSIS — Z780 Asymptomatic menopausal state: Secondary | ICD-10-CM | POA: Diagnosis not present

## 2023-09-14 DIAGNOSIS — M810 Age-related osteoporosis without current pathological fracture: Secondary | ICD-10-CM | POA: Diagnosis not present

## 2023-09-29 DIAGNOSIS — H35033 Hypertensive retinopathy, bilateral: Secondary | ICD-10-CM | POA: Diagnosis not present

## 2023-10-09 DIAGNOSIS — Z1231 Encounter for screening mammogram for malignant neoplasm of breast: Secondary | ICD-10-CM | POA: Diagnosis not present

## 2023-11-06 DIAGNOSIS — L570 Actinic keratosis: Secondary | ICD-10-CM | POA: Diagnosis not present

## 2023-11-06 DIAGNOSIS — L309 Dermatitis, unspecified: Secondary | ICD-10-CM | POA: Diagnosis not present

## 2023-11-06 DIAGNOSIS — I781 Nevus, non-neoplastic: Secondary | ICD-10-CM | POA: Diagnosis not present

## 2024-02-22 DIAGNOSIS — R7301 Impaired fasting glucose: Secondary | ICD-10-CM | POA: Diagnosis not present

## 2024-02-22 DIAGNOSIS — E782 Mixed hyperlipidemia: Secondary | ICD-10-CM | POA: Diagnosis not present

## 2024-02-22 DIAGNOSIS — M8580 Other specified disorders of bone density and structure, unspecified site: Secondary | ICD-10-CM | POA: Diagnosis not present

## 2024-02-27 DIAGNOSIS — I10 Essential (primary) hypertension: Secondary | ICD-10-CM | POA: Diagnosis not present

## 2024-02-27 DIAGNOSIS — K580 Irritable bowel syndrome with diarrhea: Secondary | ICD-10-CM | POA: Diagnosis not present

## 2024-02-27 DIAGNOSIS — S161XXA Strain of muscle, fascia and tendon at neck level, initial encounter: Secondary | ICD-10-CM | POA: Diagnosis not present

## 2024-02-27 DIAGNOSIS — M8580 Other specified disorders of bone density and structure, unspecified site: Secondary | ICD-10-CM | POA: Diagnosis not present

## 2024-02-27 DIAGNOSIS — R7301 Impaired fasting glucose: Secondary | ICD-10-CM | POA: Diagnosis not present

## 2024-02-27 DIAGNOSIS — K219 Gastro-esophageal reflux disease without esophagitis: Secondary | ICD-10-CM | POA: Diagnosis not present

## 2024-02-27 DIAGNOSIS — E782 Mixed hyperlipidemia: Secondary | ICD-10-CM | POA: Diagnosis not present

## 2024-02-27 DIAGNOSIS — M545 Low back pain, unspecified: Secondary | ICD-10-CM | POA: Diagnosis not present

## 2024-02-27 DIAGNOSIS — B9733 Human T-cell lymphotrophic virus, type I [HTLV-I] as the cause of diseases classified elsewhere: Secondary | ICD-10-CM | POA: Diagnosis not present

## 2024-03-28 DIAGNOSIS — H43813 Vitreous degeneration, bilateral: Secondary | ICD-10-CM | POA: Diagnosis not present

## 2024-05-07 DIAGNOSIS — L280 Lichen simplex chronicus: Secondary | ICD-10-CM | POA: Diagnosis not present

## 2024-05-07 DIAGNOSIS — B009 Herpesviral infection, unspecified: Secondary | ICD-10-CM | POA: Diagnosis not present

## 2024-05-09 DIAGNOSIS — H401211 Low-tension glaucoma, right eye, mild stage: Secondary | ICD-10-CM | POA: Diagnosis not present

## 2024-05-30 DIAGNOSIS — R232 Flushing: Secondary | ICD-10-CM | POA: Diagnosis not present

## 2024-05-30 DIAGNOSIS — Z20822 Contact with and (suspected) exposure to covid-19: Secondary | ICD-10-CM | POA: Diagnosis not present

## 2024-05-30 DIAGNOSIS — I10 Essential (primary) hypertension: Secondary | ICD-10-CM | POA: Diagnosis not present

## 2024-05-30 DIAGNOSIS — Z79899 Other long term (current) drug therapy: Secondary | ICD-10-CM | POA: Diagnosis not present

## 2024-05-30 DIAGNOSIS — J069 Acute upper respiratory infection, unspecified: Secondary | ICD-10-CM | POA: Diagnosis not present

## 2024-07-22 DIAGNOSIS — H409 Unspecified glaucoma: Secondary | ICD-10-CM | POA: Diagnosis not present

## 2024-07-22 DIAGNOSIS — K219 Gastro-esophageal reflux disease without esophagitis: Secondary | ICD-10-CM | POA: Diagnosis not present

## 2024-07-22 DIAGNOSIS — I10 Essential (primary) hypertension: Secondary | ICD-10-CM | POA: Diagnosis not present

## 2024-07-22 DIAGNOSIS — Z809 Family history of malignant neoplasm, unspecified: Secondary | ICD-10-CM | POA: Diagnosis not present

## 2024-07-22 DIAGNOSIS — M810 Age-related osteoporosis without current pathological fracture: Secondary | ICD-10-CM | POA: Diagnosis not present

## 2024-07-22 DIAGNOSIS — Z8249 Family history of ischemic heart disease and other diseases of the circulatory system: Secondary | ICD-10-CM | POA: Diagnosis not present

## 2024-07-22 DIAGNOSIS — Z833 Family history of diabetes mellitus: Secondary | ICD-10-CM | POA: Diagnosis not present

## 2024-07-22 DIAGNOSIS — E785 Hyperlipidemia, unspecified: Secondary | ICD-10-CM | POA: Diagnosis not present

## 2024-08-21 DIAGNOSIS — M8580 Other specified disorders of bone density and structure, unspecified site: Secondary | ICD-10-CM | POA: Diagnosis not present

## 2024-08-21 DIAGNOSIS — R7301 Impaired fasting glucose: Secondary | ICD-10-CM | POA: Diagnosis not present

## 2024-08-28 DIAGNOSIS — B9733 Human T-cell lymphotrophic virus, type I [HTLV-I] as the cause of diseases classified elsewhere: Secondary | ICD-10-CM | POA: Diagnosis not present

## 2024-08-28 DIAGNOSIS — Z0000 Encounter for general adult medical examination without abnormal findings: Secondary | ICD-10-CM | POA: Diagnosis not present

## 2024-08-28 DIAGNOSIS — E782 Mixed hyperlipidemia: Secondary | ICD-10-CM | POA: Diagnosis not present

## 2024-08-28 DIAGNOSIS — M545 Low back pain, unspecified: Secondary | ICD-10-CM | POA: Diagnosis not present

## 2024-08-28 DIAGNOSIS — K580 Irritable bowel syndrome with diarrhea: Secondary | ICD-10-CM | POA: Diagnosis not present

## 2024-08-28 DIAGNOSIS — M8580 Other specified disorders of bone density and structure, unspecified site: Secondary | ICD-10-CM | POA: Diagnosis not present

## 2024-08-28 DIAGNOSIS — R7301 Impaired fasting glucose: Secondary | ICD-10-CM | POA: Diagnosis not present

## 2024-08-28 DIAGNOSIS — I10 Essential (primary) hypertension: Secondary | ICD-10-CM | POA: Diagnosis not present

## 2024-08-28 DIAGNOSIS — S161XXA Strain of muscle, fascia and tendon at neck level, initial encounter: Secondary | ICD-10-CM | POA: Diagnosis not present
